# Patient Record
Sex: Male | Born: 1945 | Race: White | Hispanic: No | Marital: Married
Health system: Southern US, Community
[De-identification: ages and names within clinical notes are randomized; demographics above are authoritative.]

## PROBLEM LIST (undated history)

## (undated) DIAGNOSIS — C4491 Basal cell carcinoma of skin, unspecified: Secondary | ICD-10-CM

## (undated) DIAGNOSIS — C801 Malignant (primary) neoplasm, unspecified: Secondary | ICD-10-CM

## (undated) HISTORY — DX: Basal cell carcinoma of skin, unspecified: C44.91

---

## 2017-08-06 DIAGNOSIS — D239 Other benign neoplasm of skin, unspecified: Secondary | ICD-10-CM

## 2017-08-06 HISTORY — DX: Other benign neoplasm of skin, unspecified: D23.9

## 2019-08-15 ENCOUNTER — Ambulatory Visit (INDEPENDENT_AMBULATORY_CARE_PROVIDER_SITE_OTHER): Payer: Medicare Other | Admitting: Dermatology

## 2019-08-15 ENCOUNTER — Other Ambulatory Visit: Payer: Self-pay

## 2019-08-15 ENCOUNTER — Encounter: Payer: Self-pay | Admitting: Dermatology

## 2019-08-15 DIAGNOSIS — F424 Excoriation (skin-picking) disorder: Secondary | ICD-10-CM

## 2019-08-15 DIAGNOSIS — D229 Melanocytic nevi, unspecified: Secondary | ICD-10-CM

## 2019-08-15 DIAGNOSIS — D2239 Melanocytic nevi of other parts of face: Secondary | ICD-10-CM

## 2019-08-15 DIAGNOSIS — D1801 Hemangioma of skin and subcutaneous tissue: Secondary | ICD-10-CM | POA: Diagnosis not present

## 2019-08-15 DIAGNOSIS — T148XXA Other injury of unspecified body region, initial encounter: Secondary | ICD-10-CM

## 2019-08-15 DIAGNOSIS — D2262 Melanocytic nevi of left upper limb, including shoulder: Secondary | ICD-10-CM

## 2019-08-15 DIAGNOSIS — L814 Other melanin hyperpigmentation: Secondary | ICD-10-CM

## 2019-08-15 DIAGNOSIS — Z85828 Personal history of other malignant neoplasm of skin: Secondary | ICD-10-CM | POA: Diagnosis not present

## 2019-08-15 DIAGNOSIS — L821 Other seborrheic keratosis: Secondary | ICD-10-CM

## 2019-08-15 DIAGNOSIS — L578 Other skin changes due to chronic exposure to nonionizing radiation: Secondary | ICD-10-CM

## 2019-08-15 DIAGNOSIS — Z1283 Encounter for screening for malignant neoplasm of skin: Secondary | ICD-10-CM | POA: Diagnosis not present

## 2019-08-15 NOTE — Progress Notes (Signed)
   Follow-Up Visit   Subjective  Lee Young is a 74 y.o. male who presents for the following: TBSE (spot on frontal scalp for past month, has been picking at it.)  He has h/o [facility]  The following portions of the chart were reviewed this encounter and updated as appropriate:      Review of Systems:  No other skin or systemic complaints except as noted in HPI or Assessment and Plan.  Objective  Well appearing patient in no apparent distress; mood and affect are within normal limits.  A full examination was performed including scalp, head, eyes, ears, nose, lips, neck, chest, axillae, abdomen, back, buttocks, bilateral upper extremities, bilateral lower extremities, hands, feet, fingers, toes, fingernails, and toenails. All findings within normal limits unless otherwise noted below.  Objective  Left Frontal scalp: Excoriated pink macule  Objective  Left Postauricular Area: Red papule   Objective  Mid Occipital Scalp and Right ear: Well healed scar with no evidence of recurrence.   Objective  Right Tip of Nose: 4 mm flesh brown papule  Left Dorsum of Foot: 3 mm brown macule   Assessment & Plan  Excoriation Left Frontal scalp  Vaseline until healed, avoid picking area RTC if doesn't heal      Hemangioma of skin Left Postauricular Area  Benign, observe.  Discussed shave removal if irritated.   History of basal cell carcinoma (BCC) Mid Occipital Scalp and Right ear  Clear. Observe for recurrence. Call clinic for new or changing lesions.  Recommend regular skin exams, daily broad-spectrum spf 30+ sunscreen use, and photoprotection.     Nevus (2) Left Dorsum of Foot; Right Tip of Nose  Benign-appearing.  Observation.  Call clinic for new or changing moles.  Recommend daily use of broad spectrum spf 30+ sunscreen to sun-exposed areas.    Lentigines - Scattered tan macules - Discussed due to sun exposure - Benign, observe - Call for any  changes  Seborrheic Keratoses - Stuck-on, waxy, tan-brown papules and plaques  - Discussed benign etiology and prognosis. - Observe - Call for any changes Recommend Gold Bond Rough and Bumpy, Amlactin, Eucerin Roughness Relief  Melanocytic Nevi - Tan-brown and/or pink-flesh-colored symmetric macules and papules - Benign appearing on exam today - Observation - Call clinic for new or changing moles - Recommend daily use of broad spectrum spf 30+ sunscreen to sun-exposed areas.   Hemangiomas - Red papules - Discussed benign nature - Observe - Call for any changes  Actinic Damage - diffuse scaly erythematous macules with underlying dyspigmentation - Recommend daily broad spectrum sunscreen SPF 30+ to sun-exposed areas, reapply every 2 hours as needed.  - Call for new or changing lesions.  Skin cancer screening performed today.   Return in about 1 year (around 08/14/2020) for TBSE.   I, Donzetta Kohut, CMA, am acting as scribe for Brendolyn Patty, MD .  Documentation: I have reviewed the above documentation for accuracy and completeness, and I agree with the above.  Brendolyn Patty MD

## 2019-08-15 NOTE — Patient Instructions (Signed)
Recommend daily broad spectrum sunscreen SPF 30+ to sun-exposed areas, reapply every 2 hours as needed. Call for new or changing lesions.  

## 2020-05-22 ENCOUNTER — Other Ambulatory Visit: Payer: Self-pay

## 2020-05-22 ENCOUNTER — Ambulatory Visit (INDEPENDENT_AMBULATORY_CARE_PROVIDER_SITE_OTHER): Payer: Medicare Other | Admitting: Dermatology

## 2020-05-22 DIAGNOSIS — L300 Nummular dermatitis: Secondary | ICD-10-CM

## 2020-05-22 MED ORDER — CLOBETASOL PROPIONATE 0.05 % EX SOLN: CUTANEOUS | 1 refills | Status: DC

## 2020-05-22 NOTE — Progress Notes (Signed)
   Follow-Up Visit   Subjective  Lee Young is a 75 y.o. male who presents for the following: Rash (Patient here today for a rash that started on his legs in late January. Since then it has spread to trunk. Rash is itchy initially, but improved when he uses triamcinolone 0.5% ointment. He used to use Lever 2000 soap, but recently switched to [place] or Caress soap. For the past two days, he has used an antifungal soap (Defense). No new medicines.). No h/o eczema.   The following portions of the chart were reviewed this encounter and updated as appropriate:       Review of Systems:  No other skin or systemic complaints except as noted in HPI or Assessment and Plan.  Objective  Well appearing patient in no apparent distress; mood and affect are within normal limits.  A focused examination was performed including trunk, extremities. Relevant physical exam findings are noted in the Assessment and Plan.  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 Avoid face, groin, axilla.  Up to four weeks.  May spot treat severe areas with TMC 0.5% ointment qd/bid prn. Avoid f/g/a.  Recommend mild soap and moisturizing cream 1-2 times daily to help prevent flares. Dry skin care handout given   Topical steroids (such as triamcinolone, fluocinolone, fluocinonide, mometasone, clobetasol, halobetasol, betamethasone, hydrocortisone) can cause thinning and lightening of the skin if they are used for too long in the same area. Your physician has selected the right strength medicine for your problem and area affected on the body. Please use your medication only  as directed by your physician to prevent side effects.    clobetasol (TEMOVATE) 0.05 % external solution - legs, abdomen  Return as scheduled.  IJamesetta Orleans, CMA, am acting as scribe for Brendolyn Patty, MD .  Documentation: I have reviewed the above documentation for accuracy and completeness, and I agree with the above.  Brendolyn Patty MD

## 2020-05-22 NOTE — Patient Instructions (Addendum)
Eczema Skin Care  Buy TWO 16oz jars of CeraVe moisturizing cream  [facility], [facility], [facility] (no prescription needed)  Costs about $15 per jar   Jar #1: Use as a moisturizer as needed. Can be applied to any area of the body. Use twice daily to unaffected areas.  Jar #2: Pour one 73ml bottle of clobetasol 0.05% solution into jar, mix well. Label this jar to indicate the medication has been added. Use twice daily to affected areas. Do not apply to face, groin or underarms.  Moisturizer may burn or sting initially. Try for at least 4 weeks.    Dry Skin Care  What causes dry skin?  Dry skin is common and results from inadequate moisture in the outer skin layers. Dry skin usually results from the excessive loss of moisture from the skin surface. This occurs due to two major factors: 1. Normally the skin's oil glands deposit a layer of oil on the skin's surface. This layer of oil prevents the loss of moisture from the skin. Exposure to soaps, cleaners, solvents, and disinfectants removes this oily film, allowing water to escape. 2. Water loss from the skin increases when the humidity is low. During winter months we spend a lot of time indoors where the air is heated. Heated air has very low humidity. This also contributes to dry skin.  A tendency for dry skin may accompany such disorders as eczema. Also, as people age, the number of functioning oil glands decreases, and the tendency toward dry skin can be a sensation of skin tightness when emerging from the shower.  How do I manage dry skin?  1. Humidify your environment. This can be accomplished by using a humidifier in your bedroom at night during winter months. 2. Bathing can actually put moisture back into your skin if done right. Take the following steps while bathing to sooth dry skin:  Avoid hot water, which only dries the skin and makes itching worse. Use warm water.  Avoid washcloths or extensive rubbing or scrubbing.  Use mild soaps  like unscented Dove, Oil of Olay, Cetaphil, Basis, or CeraVe.  If you take baths rather than showers, rinse off soap residue with clean water before getting out of tub.  Once out of the shower/tub, pat dry gently with a soft towel. Leave your skin damp.  While still damp, apply any medicated ointment/cream you were prescribed to the affected areas. After you apply your medicated ointment/cream, then apply your moisturizer to your whole body.This is the most important step in dry skin care. If this is omitted, your skin will continue to be dry.  The choice of moisturizer is also very important. In general, lotion will not provider enough moisture to severely dry skin because it is water based. You should use an ointment or cream. Moisturizers should also be unscented. Good choices include Vaseline (plain petrolatum), Aquaphor, Cetaphil, CeraVe, Vanicream, DML Forte, Aveeno moisture, or Eucerin Cream.  Bath oils can be helpful, but do not replace the application of moisturizer after the bath. In addition, they make the tub slippery causing an increased risk for falls. Therefore, we do not recommend their use.

## 2020-07-09 ENCOUNTER — Emergency Department
Admission: EM | Admit: 2020-07-09 | Discharge: 2020-07-09 | Disposition: A | Discharge status: Home / Self Care | Payer: Medicare Other | Attending: Emergency Medicine | Admitting: Emergency Medicine

## 2020-07-09 ENCOUNTER — Emergency Department: Payer: Medicare Other

## 2020-07-09 ENCOUNTER — Other Ambulatory Visit: Payer: Self-pay

## 2020-07-09 DIAGNOSIS — Z20822 Contact with and (suspected) exposure to covid-19: Secondary | ICD-10-CM | POA: Insufficient documentation

## 2020-07-09 DIAGNOSIS — R1013 Epigastric pain: Secondary | ICD-10-CM | POA: Insufficient documentation

## 2020-07-09 DIAGNOSIS — R61 Generalized hyperhidrosis: Secondary | ICD-10-CM | POA: Insufficient documentation

## 2020-07-09 DIAGNOSIS — Z859 Personal history of malignant neoplasm, unspecified: Secondary | ICD-10-CM | POA: Insufficient documentation

## 2020-07-09 DIAGNOSIS — R0602 Shortness of breath: Secondary | ICD-10-CM | POA: Diagnosis not present

## 2020-07-09 DIAGNOSIS — J189 Pneumonia, unspecified organism: Secondary | ICD-10-CM | POA: Diagnosis not present

## 2020-07-09 DIAGNOSIS — R071 Chest pain on breathing: Secondary | ICD-10-CM

## 2020-07-09 DIAGNOSIS — R0789 Other chest pain: Secondary | ICD-10-CM

## 2020-07-09 DIAGNOSIS — R072 Precordial pain: Secondary | ICD-10-CM | POA: Diagnosis present

## 2020-07-09 HISTORY — DX: Malignant (primary) neoplasm, unspecified: C80.1

## 2020-07-09 LAB — BASIC METABOLIC PANEL
Anion gap: 6 (ref 5–15)
BUN: 11 mg/dL (ref 8–23)
CO2: 28 mmol/L (ref 22–32)
Calcium: 9.2 mg/dL (ref 8.9–10.3)
Chloride: 103 mmol/L (ref 98–111)
Creatinine, Ser: 0.79 mg/dL (ref 0.61–1.24)
GFR, Estimated: >60 mL/min (ref >60)
Glucose, Bld: 116 mg/dL — ABNORMAL HIGH (ref 70–99)
Potassium: 3.8 mmol/L (ref 3.5–5.1)
Sodium: 137 mmol/L (ref 135–145)

## 2020-07-09 LAB — TROPONIN I (HIGH SENSITIVITY)
Troponin I (High Sensitivity): 5 ng/L (ref <18)
Troponin I (High Sensitivity): 5 ng/L (ref <18)

## 2020-07-09 LAB — CBC
HCT: 42.1 % (ref 39.0–52.0)
Hemoglobin: 14.5 g/dL (ref 13.0–17.0)
MCH: 31.4 pg (ref 26.0–34.0)
MCHC: 34.4 g/dL (ref 30.0–36.0)
MCV: 91.1 fL (ref 80.0–100.0)
Platelets: 156 10*3/uL (ref 150–400)
RBC: 4.62 MIL/uL (ref 4.22–5.81)
RDW: 12.6 % (ref 11.5–15.5)
WBC: 12 10*3/uL — ABNORMAL HIGH (ref 4.0–10.5)
nRBC: 0 % (ref 0.0–0.2)

## 2020-07-09 LAB — HEPATIC FUNCTION PANEL
ALT: 13 U/L (ref 0–44)
AST: 24 U/L (ref 15–41)
Albumin: 4.3 g/dL (ref 3.5–5.0)
Alkaline Phosphatase: 42 U/L (ref 38–126)
Bilirubin, Direct: 0.2 mg/dL (ref 0.0–0.2)
Indirect Bilirubin: 1.1 mg/dL — ABNORMAL HIGH (ref 0.3–0.9)
Total Bilirubin: 1.3 mg/dL — ABNORMAL HIGH (ref 0.3–1.2)
Total Protein: 6.6 g/dL (ref 6.5–8.1)

## 2020-07-09 LAB — LIPASE, BLOOD: Lipase: 27 U/L (ref 11–51)

## 2020-07-09 LAB — RESP PANEL BY RT-PCR (FLU A&B, COVID) ARPGX2
Influenza A by PCR: NEGATIVE
Influenza B by PCR: NEGATIVE
SARS Coronavirus 2 by RT PCR: NEGATIVE

## 2020-07-09 LAB — BRAIN NATRIURETIC PEPTIDE: B Natriuretic Peptide: 39.4 pg/mL (ref 0.0–100.0)

## 2020-07-09 IMAGING — CT CT ANGIO CHEST-ABD-PELV FOR DISSECTION W/ AND WO/W CM
2 of 7 series · 13 of 46 positions shown, 15 images · non-contrast
Comparison: Chest radiograph [DATE]

CLINICAL DATA: Midsternal chest pain and shortness of breath
starting a few hours ago.

EXAM:
CT ANGIOGRAPHY CHEST, ABDOMEN AND PELVIS
TECHNIQUE: Non-contrast CT of the chest was initially obtained.

[Series 5: axial arterial · axial · arterial · 0.78mm/px · z∈[-161,+379]mm · 10 of 220 slices shown, 12 images]
[im 20/220  soft-tissue]
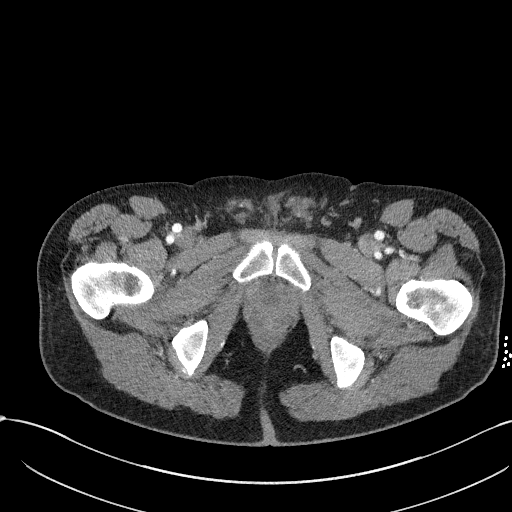
[im 20/220  bone]
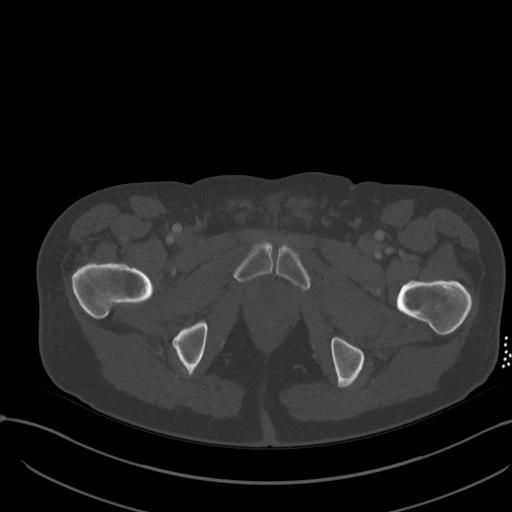
[im 40/220  soft-tissue]
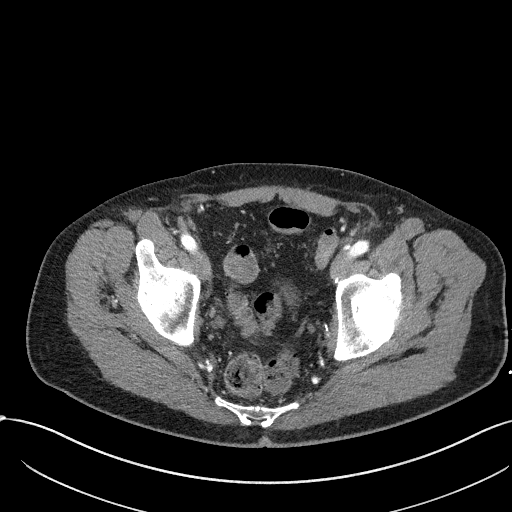
[im 60/220  soft-tissue]
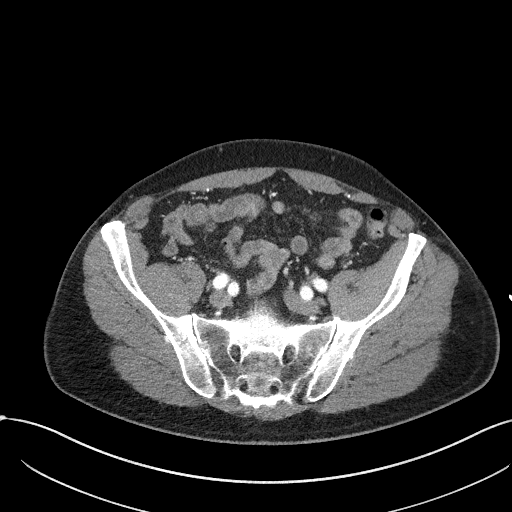
[im 80/220  soft-tissue]
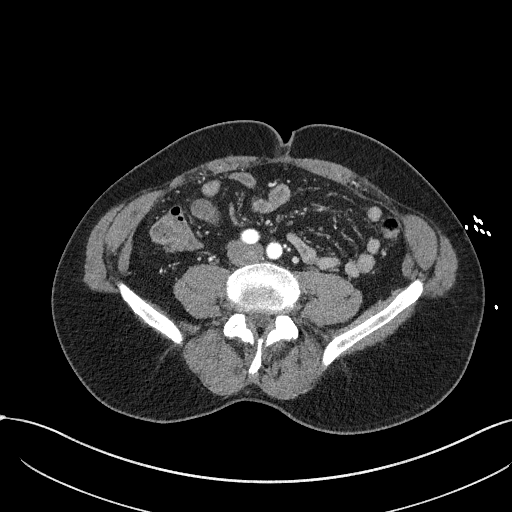
[im 100/220  soft-tissue]
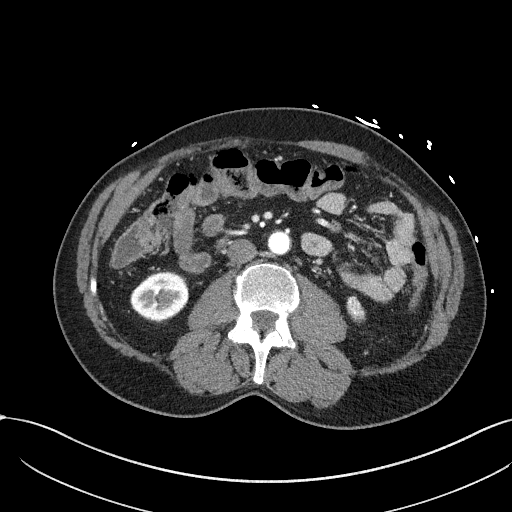
[im 120/220  soft-tissue]
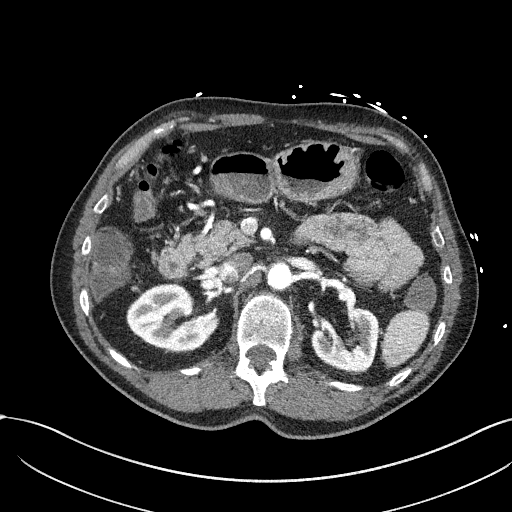
[im 140/220  soft-tissue]
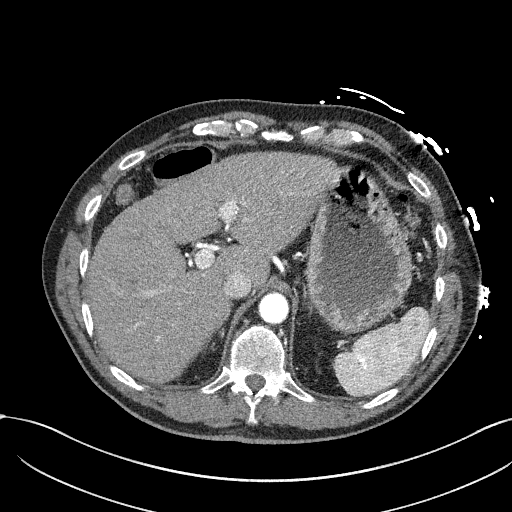
[im 160/220  soft-tissue]
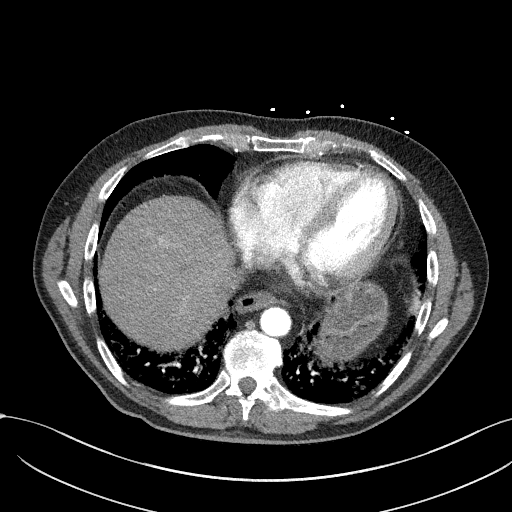
[im 180/220  soft-tissue]
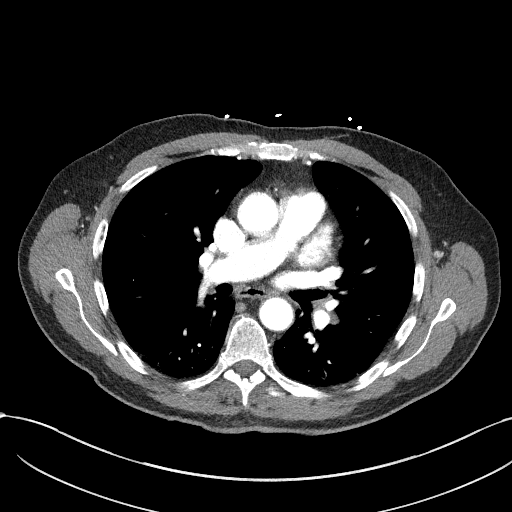
[im 180/220  bone]
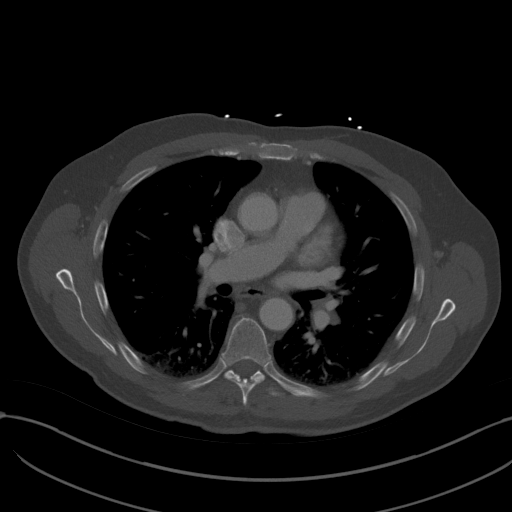
[im 200/220  soft-tissue]
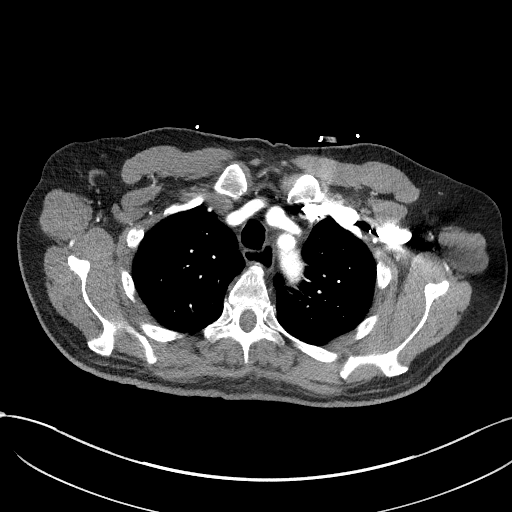

[Series 7: coronals · coronal · 0.83mm/px · 3 of 135 slices shown]
[im 34/135  soft-tissue]
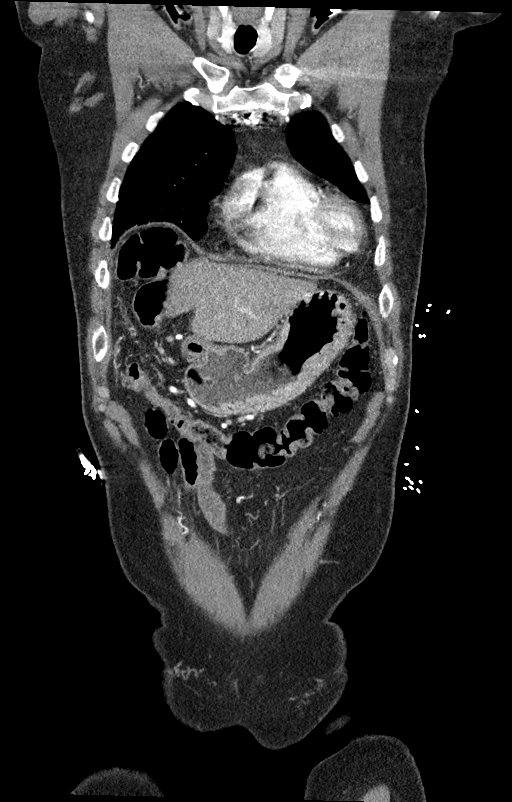
[im 68/135  soft-tissue]
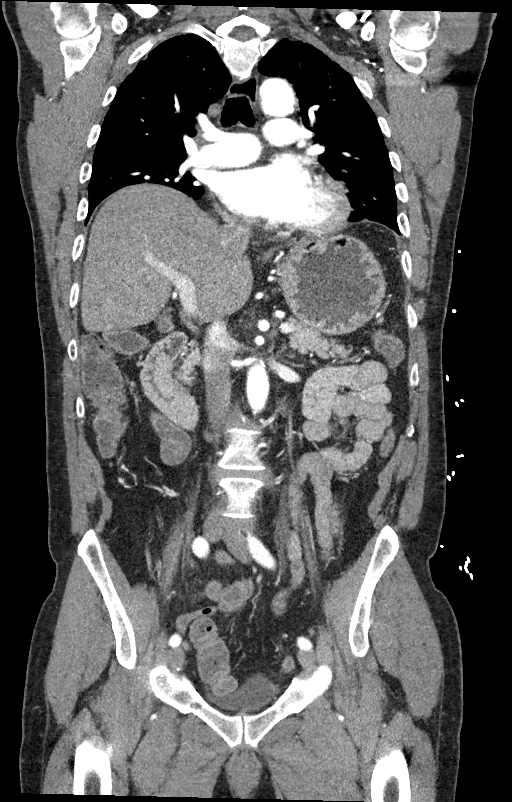
[im 101/135  soft-tissue]
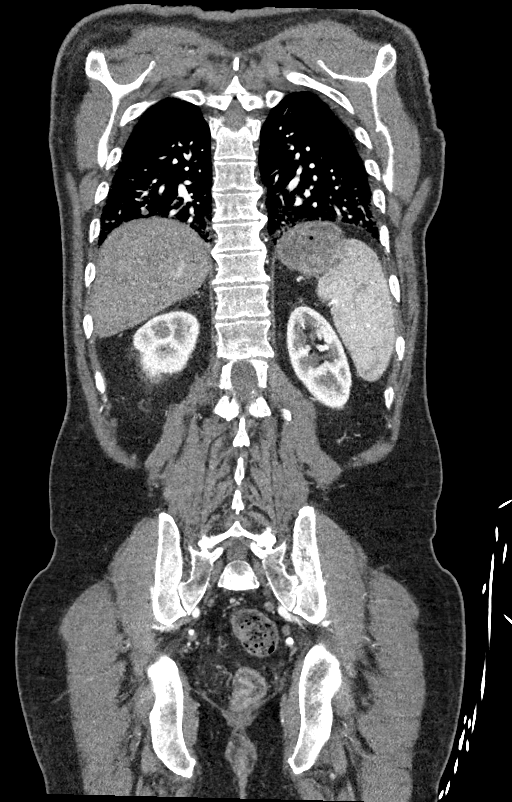

[13 of 46 positions shown; findings below may reference images not displayed]

Multidetector CT imaging through the chest, abdomen and pelvis was
performed using the standard protocol during bolus administration of
intravenous contrast. Multiplanar reconstructed images and MIPs were
obtained and reviewed to evaluate the vascular anatomy.

CONTRAST:  100mL OMNIPAQUE IOHEXOL 350 MG/ML SOLN
FINDINGS: CTA CHEST FINDINGS

Cardiovascular: Unenhanced images of the chest demonstrate scattered
aortic calcification. No evidence of intramural hematoma.

Images obtained during the arterial phase after IV contrast
administration demonstrate normal caliber thoracic aorta. No
aneurysm or dissection. Motion artifact in the aortic root. Great
vessel origins are patent. Central pulmonary arteries are patent
without evidence of significant pulmonary embolus. Normal heart
size. No pericardial effusions.

Mediastinum/Nodes: Esophagus is decompressed. No significant
lymphadenopathy. Thyroid gland is unremarkable.

Lungs/Pleura: Atelectasis or consolidation in both lung bases. No
pleural effusion or pneumothorax.

Musculoskeletal: No chest wall abnormality. No acute or significant
osseous findings.

Review of the MIP images confirms the above findings.

CTA ABDOMEN AND PELVIS FINDINGS

VASCULAR

Aorta: Normal caliber aorta without aneurysm, dissection, vasculitis
or significant stenosis.

Celiac: Patent without evidence of aneurysm, dissection, vasculitis
or significant stenosis.

SMA: Patent without evidence of aneurysm, dissection, vasculitis or
significant stenosis.

Renals: Both renal arteries are patent without evidence of aneurysm,
dissection, vasculitis, fibromuscular dysplasia or significant
stenosis.

IMA: Patent without evidence of aneurysm, dissection, vasculitis or
significant stenosis.

Inflow: Patent without evidence of aneurysm, dissection, vasculitis
or significant stenosis.

Veins: No obvious venous abnormality within the limitations of this
arterial phase study.

Review of the MIP images confirms the above findings.

NON-VASCULAR

Hepatobiliary: Mild diffuse fatty infiltration of the liver.
Low-attenuation lesion in the lateral segment left lobe, likely a
cyst. Portal veins are patent. Mild gallbladder wall thickening
likely due to contraction. No gallstones or bile duct dilatation
identified.

Pancreas: Unremarkable. No pancreatic ductal dilatation or
surrounding inflammatory changes.

Spleen: Normal in size without focal abnormality.

Adrenals/Urinary Tract: Adrenal glands are unremarkable. Kidneys are
normal, without renal calculi, focal lesion, or hydronephrosis.
Bladder is unremarkable.

Stomach/Bowel: The stomach, small bowel, and colon are not
abnormally distended. No wall thickening or inflammatory changes are
appreciated. Appendix is not identified.

Lymphatic: No significant lymphadenopathy.

Reproductive: Prostate is unremarkable.

Other: No free air or free fluid in the abdomen.

Musculoskeletal: No acute or significant osseous findings.

Review of the MIP images confirms the above findings.
IMPRESSION: 1. No evidence of aneurysm or dissection of the thoracic or
abdominal aorta.
2. No evidence of significant pulmonary embolus.
3. Atelectasis or consolidation in both lung bases.
4. Mild diffuse fatty infiltration of the liver.
5. Benign-appearing cyst in the left lobe of the liver.
6. No acute process demonstrated in the abdomen or pelvis.

Aortic Atherosclerosis ([JE]-[JE]).

## 2020-07-09 MED ORDER — SODIUM CHLORIDE 0.9 % IV BOLUS
1000.0000 mL | Freq: Once | INTRAVENOUS | Status: AC
  Administered 2020-07-09: 1000 mL via INTRAVENOUS

## 2020-07-09 MED ORDER — ONDANSETRON HCL 4 MG/2ML IJ SOLN
4.0000 mg | Freq: Once | INTRAMUSCULAR | Status: AC
  Administered 2020-07-09: 4 mg via INTRAVENOUS
  Filled 2020-07-09: qty 2

## 2020-07-09 MED ORDER — NAPROXEN 375 MG PO TABS: 375.0000 mg | ORAL_TABLET | Freq: Two times a day (BID) | ORAL | 0 refills | Status: AC

## 2020-07-09 MED ORDER — DEXAMETHASONE SODIUM PHOSPHATE 10 MG/ML IJ SOLN
10.0000 mg | Freq: Once | INTRAMUSCULAR | Status: AC
  Administered 2020-07-09: 10 mg via INTRAVENOUS
  Filled 2020-07-09: qty 1

## 2020-07-09 MED ORDER — ASPIRIN 81 MG PO CHEW
324.0000 mg | CHEWABLE_TABLET | Freq: Once | ORAL | Status: AC
  Administered 2020-07-09: 324 mg via ORAL
  Filled 2020-07-09: qty 4

## 2020-07-09 MED ORDER — KETOROLAC TROMETHAMINE 30 MG/ML IJ SOLN
15.0000 mg | Freq: Once | INTRAMUSCULAR | Status: AC
  Administered 2020-07-09: 15 mg via INTRAVENOUS
  Filled 2020-07-09: qty 1

## 2020-07-09 MED ORDER — MORPHINE SULFATE (PF) 4 MG/ML IV SOLN
4.0000 mg | Freq: Once | INTRAVENOUS | Status: DC
  Filled 2020-07-09: qty 1

## 2020-07-09 MED ORDER — IOHEXOL 350 MG/ML SOLN
100.0000 mL | Freq: Once | INTRAVENOUS | Status: AC | PRN
  Administered 2020-07-09: 100 mL via INTRAVENOUS

## 2020-07-09 MED ORDER — AZITHROMYCIN 250 MG PO TABS: ORAL_TABLET | ORAL | 0 refills | Status: DC

## 2020-07-09 MED ORDER — NITROGLYCERIN 0.4 MG SL SUBL
0.4000 mg | SUBLINGUAL_TABLET | SUBLINGUAL | Status: DC | PRN
  Administered 2020-07-09: 0.4 mg via SUBLINGUAL
  Filled 2020-07-09: qty 1

## 2020-07-09 NOTE — ED Provider Notes (Signed)
[facility] Emergency Department Provider Note  ____________________________________________   Event Date/Time   First MD Initiated Contact with Patient 07/09/20 1816     (approximate)  I have reviewed the triage vital signs and the nursing notes.   HISTORY  Chief Complaint Chest Pain    HPI Lee Young is a 75 y.o. male with  history of hypertension, hyperlipidemia, here with chest pain.  The patient states that earlier this afternoon, he developed acute, severe, substernal and epigastric chest pain with associated shortness of breath.  He was just resting at the time.  He had been at the beach over the weekend but denies any significant trauma.  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  He reports that over the last hour or 2, the pain has worsened.  Shortness of breath is improved somewhat but he has now developed nausea and sensation like he needs to vomit.  Denies any preceding symptoms.  No leg swelling.  He drove several hours to the beach but has no leg swelling or signs of DVT or PE.  No history of cardiac issues.  No recent medication changes.  Pain seems slightly worse when lying flat.  No specific alleviating factors.       Past Medical History:  Diagnosis Date  . Cancer ([place])   . 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   History reviewed. No pertinent surgical history.  Prior to Admission medications   Medication Sig Start Date End Date Taking? Authorizing Provider  azithromycin (ZITHROMAX Z-PAK) 250 MG tablet Take 2 tablets (500 mg) on  Day 1,  followed by 1 tablet (250 mg) once daily on Days 2 through 5. 07/09/20  Yes Duffy Bruce, MD  naproxen (NAPROSYN) 375 MG tablet Take 1 tablet (375 mg total) by mouth 2 (two) times daily with a meal for 7 days. 07/09/20 07/16/20 Yes Duffy Bruce, MD  atorvastatin (LIPITOR) 10 MG tablet Take by  mouth. 11/01/18   [provider]  clobetasol (TEMOVATE) 0.05 % external solution Patient to mix solution in jar of CeraVe Cream. Apply all over twice daily until rash improved. Avoid face, groin, underarms. 05/22/20   Brendolyn Patty, MD  famotidine (PEPCID) 40 MG tablet Take by mouth. 12/06/18 12/06/19  [provider]  omeprazole (PRILOSEC) 20 MG capsule Take by mouth. 12/06/18   [provider]    Allergies Patient has no known allergies.  No family history on file.  Social History    Review of Systems  Review of Systems  Constitutional: Positive for fatigue. Negative for chills and fever.  HENT: Negative for sore throat.   Respiratory: Positive for chest tightness and shortness of breath.   Cardiovascular: Negative for chest pain.  Gastrointestinal: Positive for nausea. Negative for abdominal pain.  Genitourinary: Negative for flank pain.  Musculoskeletal: Negative for neck pain.  Skin: Negative for rash and wound.  Allergic/Immunologic: Negative for immunocompromised state.  Neurological: Positive for weakness. Negative for numbness.  Hematological: Does not bruise/bleed easily.  All other systems reviewed and are negative.    ____________________________________________  PHYSICAL EXAM:      VITAL SIGNS: ED Triage Vitals  Enc Vitals Group     BP 07/09/20 1815 (!) 176/92     Pulse Rate 07/09/20 1815 85     Resp 07/09/20 1815 (!) 26     Temp 07/09/20 1816 97.7  F (36.5 C)     Temp Source 07/09/20 1816 Oral     SpO2 07/09/20 1815 97 %     Weight 07/09/20 1813 170 lb (77.1 kg)     Height 07/09/20 1813 5\' 10"  (1.778 m)     Head Circumference --      Peak Flow --      Pain Score 07/09/20 1813 8     Pain Loc --      Pain Edu? --      Excl. in [place]? --      Physical Exam Vitals and nursing note reviewed.  Constitutional:      General: He is not in acute distress.    Appearance: He is well-developed. He is diaphoretic.  HENT:     Head:  Normocephalic and atraumatic.  Eyes:     Conjunctiva/sclera: Conjunctivae normal.  Cardiovascular:     Rate and Rhythm: Normal rate and regular rhythm.     Heart sounds: Normal heart sounds. No murmur heard. No friction rub.  Pulmonary:     Effort: Pulmonary effort is normal. No respiratory distress.     Breath sounds: Normal breath sounds. No wheezing or rales.  Abdominal:     General: There is no distension.     Palpations: Abdomen is soft.     Tenderness: There is no abdominal tenderness.     Comments: Mild epigastric  Musculoskeletal:     Cervical back: Neck supple.  Skin:    General: Skin is warm.     Capillary Refill: Capillary refill takes less than 2 seconds.  Neurological:     Mental Status: He is alert and oriented to person, place, and time.     Motor: No abnormal muscle tone.       ____________________________________________   LABS (all labs ordered are listed, but only abnormal results are displayed)  Labs Reviewed  BASIC METABOLIC PANEL - Abnormal; Notable for the following components:      Result Value   Glucose, Bld 116 (*)    All other components within normal limits  CBC - Abnormal; Notable for the following components:   WBC 12.0 (*)    All other components within normal limits  HEPATIC FUNCTION PANEL - Abnormal; Notable for the following components:   Total Bilirubin 1.3 (*)    Indirect Bilirubin 1.1 (*)    All other components within normal limits  RESP PANEL BY RT-PCR (FLU A&B, COVID) ARPGX2  BRAIN NATRIURETIC PEPTIDE  LIPASE, BLOOD  TROPONIN I (HIGH SENSITIVITY)  TROPONIN I (HIGH SENSITIVITY)    ____________________________________________  EKG: Normal sinus rhythm, ventricular rate 88.  PR 164, QRS 96, QTc 423.  No acute ST elevations or depressions. ________________________________________  RADIOLOGY All imaging, including plain films, CT scans, and ultrasounds, independently reviewed by me, and interpretations confirmed via formal  radiology reads.  ED MD interpretation:   CT Dissection: No dissection, no PE, atelectasis or consolidation both lung bases CXR: L basilar atelectasis  Official radiology report(s): DG Chest 1 View  Result Date: 07/09/2020 CLINICAL DATA:  Chest pain and shortness of breath. EXAM: CHEST  1 VIEW COMPARISON:  None. FINDINGS: Lung volumes are low. Upper normal heart size. Normal mediastinal contours. Mild left basilar atelectasis. No confluent consolidation. No pulmonary edema, pneumothorax or large pleural effusion. No acute osseous abnormalities are seen. IMPRESSION: Low lung volumes with left basilar atelectasis. Electronically Signed   By: Keith Rake M.D.   On: 07/09/2020 19:22   CT Angio Chest/Abd/Pel for  Dissection W and/or Wo Contrast  Result Date: 07/09/2020 CLINICAL DATA:  Midsternal chest pain and shortness of breath starting a few hours ago. EXAM: CT ANGIOGRAPHY CHEST, ABDOMEN AND PELVIS TECHNIQUE: Non-contrast CT of the chest was initially obtained. Multidetector CT imaging through the chest, abdomen and pelvis was performed using the standard protocol during bolus administration of intravenous contrast. Multiplanar reconstructed images and MIPs were obtained and reviewed to evaluate the vascular anatomy. CONTRAST:  121mL OMNIPAQUE IOHEXOL 350 MG/ML SOLN COMPARISON:  Chest radiograph 07/09/2020 FINDINGS: CTA CHEST FINDINGS Cardiovascular: Unenhanced images of the chest demonstrate scattered aortic calcification. No evidence of intramural hematoma. Images obtained during the arterial phase after IV contrast administration demonstrate normal caliber thoracic aorta. No aneurysm or dissection. Motion artifact in the aortic root. Great vessel origins are patent. Central pulmonary arteries are patent without evidence of significant pulmonary embolus. Normal heart size. No pericardial effusions. Mediastinum/Nodes: Esophagus is decompressed. No significant lymphadenopathy. Thyroid gland is  unremarkable. Lungs/Pleura: Atelectasis or consolidation in both lung bases. No pleural effusion or pneumothorax. Musculoskeletal: No chest wall abnormality. No acute or significant osseous findings. Review of the MIP images confirms the above findings. CTA ABDOMEN AND PELVIS FINDINGS VASCULAR Aorta: Normal caliber aorta without aneurysm, dissection, vasculitis or significant stenosis. Celiac: Patent without evidence of aneurysm, dissection, vasculitis or significant stenosis. SMA: Patent without evidence of aneurysm, dissection, vasculitis or significant stenosis. Renals: Both renal arteries are patent without evidence of aneurysm, dissection, vasculitis, fibromuscular dysplasia or significant stenosis. IMA: Patent without evidence of aneurysm, dissection, vasculitis or significant stenosis. Inflow: Patent without evidence of aneurysm, dissection, vasculitis or significant stenosis. Veins: No obvious venous abnormality within the limitations of this arterial phase study. Review of the MIP images confirms the above findings. NON-VASCULAR Hepatobiliary: Mild diffuse fatty infiltration of the liver. Low-attenuation lesion in the lateral segment left lobe, likely a cyst. Portal veins are patent. Mild gallbladder wall thickening likely due to contraction. No gallstones or bile duct dilatation identified. Pancreas: Unremarkable. No pancreatic ductal dilatation or surrounding inflammatory changes. Spleen: Normal in size without focal abnormality. Adrenals/Urinary Tract: Adrenal glands are unremarkable. Kidneys are normal, without renal calculi, focal lesion, or hydronephrosis. Bladder is unremarkable. Stomach/Bowel: The stomach, small bowel, and colon are not abnormally distended. No wall thickening or inflammatory changes are appreciated. Appendix is not identified. Lymphatic: No significant lymphadenopathy. Reproductive: Prostate is unremarkable. Other: No free air or free fluid in the abdomen. Musculoskeletal: No acute  or significant osseous findings. Review of the MIP images confirms the above findings. IMPRESSION: 1. No evidence of aneurysm or dissection of the thoracic or abdominal aorta. 2. No evidence of significant pulmonary embolus. 3. Atelectasis or consolidation in both lung bases. 4. Mild diffuse fatty infiltration of the liver. 5. Benign-appearing cyst in the left lobe of the liver. 6. No acute process demonstrated in the abdomen or pelvis. Aortic Atherosclerosis (ICD10-I70.0). Electronically Signed   By: Lucienne Capers M.D.   On: 07/09/2020 20:26    ____________________________________________  PROCEDURES   Procedure(s) performed (including Critical Care):  Procedures  ____________________________________________  INITIAL IMPRESSION / MDM / [place] / ED COURSE  As part of my medical decision making, I reviewed the following data within the [place] notes reviewed and incorporated, Old chart reviewed, Notes from prior ED visits, and [place] Controlled Substance Database       *Lee Young was evaluated in Emergency Department on 07/10/2020 for the symptoms described in the history of present illness. He was evaluated  in the context of the global COVID-19 pandemic, which necessitated consideration that the patient might be at risk for infection with the SARS-CoV-2 virus that causes COVID-19. Institutional protocols and algorithms that pertain to the evaluation of patients at risk for COVID-19 are in a state of rapid change based on information released by regulatory bodies including the CDC and federal and state organizations. These policies and algorithms were followed during the patient's care in the ED.  Some ED evaluations and interventions may be delayed as a result of limited staffing during the pandemic.*     Medical Decision Making:  75 yo M here with acute, somewhat pleuritic and positional chest pain and SOB. Pt in moderate discomfort on arrival,  hypertensive. Pt given nitro, aspirin. EKG nonischemic initially and on repeat x 2. Lab work overall very reassuring - trop negative x 2 despite constant sx, and CBC, CMP unremarkable. Lipase, LFTs normal with no abd ttp to suggest referred pain from GI source. Given his significant pain, HTN, and radiation of pain, CT Angio obtained and shows no dissection or significant PE. Of note, pt has b/l atelectasis vs consolidation on CT. He notes that his somewhat positional, pleuritic pain began after he had been working/moving [place] the beach all day for the last several days. He was exposed to drywall, fumes, and multiple irritants during this time.   Suspect MSK chest wall pain, possibly in setting of pneumonitis vs atypical PNA. Pt is non-toxic, well appearing, satting >95% on RA. VS remain stable. He was given toradol with improvement in ED. Will give dose of decadron, d/c with azithro and course of naproxen. Return precautions given.  ____________________________________________  FINAL CLINICAL IMPRESSION(S) / ED DIAGNOSES  Final diagnoses:  Atypical chest pain  Pneumonitis     MEDICATIONS GIVEN DURING THIS VISIT:  Medications  nitroGLYCERIN (NITROSTAT) SL tablet 0.4 mg (0.4 mg Sublingual Given 07/09/20 1858)  morphine 4 MG/ML injection 4 mg (4 mg Intravenous Not Given 07/09/20 2103)  aspirin chewable tablet 324 mg (324 mg Oral Given 07/09/20 1858)  ondansetron (ZOFRAN) injection 4 mg (4 mg Intravenous Given 07/09/20 1859)  sodium chloride 0.9 % bolus 1,000 mL (0 mLs Intravenous Stopped 07/09/20 2102)  iohexol (OMNIPAQUE) 350 MG/ML injection 100 mL (100 mLs Intravenous Contrast Given 07/09/20 2005)  ketorolac (TORADOL) 30 MG/ML injection 15 mg (15 mg Intravenous Given 07/09/20 2103)  dexamethasone (DECADRON) injection 10 mg (10 mg Intravenous Given 07/09/20 2337)     ED Discharge Orders         Ordered    naproxen (NAPROSYN) 375 MG tablet  2 times daily with meals        07/09/20  2306    azithromycin (ZITHROMAX Z-PAK) 250 MG tablet        07/09/20 2306           Note:  This document was prepared using Dragon voice recognition software and may include unintentional dictation errors.   Duffy Bruce, MD 07/10/20 [phone]

## 2020-07-09 NOTE — ED Triage Notes (Signed)
Pt comes with c/o mid sternal CP and SOB. Pt states this started few hours ago. Pt states he is having more difficulty breathing in and out. Pt states this has gotten worse in the last hour.  Pt denies any radiation, N/V/D.

## 2020-07-09 NOTE — ED Notes (Signed)
Pt to CT with RN

## 2020-07-09 NOTE — ED Notes (Signed)
Pt ambulatory in hallway with pulse ox. SpO2 96% prior to ambulation, decreased to 92-93% with ambulation. Pt reports some increased CP but only d/t having to take deep breaths while ambulating. Denies increased SOB, dizziness, lightheadedness.

## 2020-07-09 NOTE — ED Notes (Signed)
Contacted lab to add on labs.  

## 2020-07-09 NOTE — ED Notes (Signed)
This RN at bedside to introduce self to pt. Pt reports 2/10 chest pain after nitro, states symptoms have lessened after meds. Pt does appear slightly pale on arrival to room, appears uncomfortable. VS checked, initial BP 87/57, BP cuff adjusted, repeat BP 90/51. 1L NS bolus started per verbal order from Ellender Hose MD

## 2020-09-03 ENCOUNTER — Encounter: Payer: Self-pay | Admitting: Dermatology

## 2020-09-03 ENCOUNTER — Ambulatory Visit (INDEPENDENT_AMBULATORY_CARE_PROVIDER_SITE_OTHER): Payer: Medicare Other | Admitting: Dermatology

## 2020-09-03 ENCOUNTER — Other Ambulatory Visit: Payer: Self-pay

## 2020-09-03 DIAGNOSIS — Z85828 Personal history of other malignant neoplasm of skin: Secondary | ICD-10-CM

## 2020-09-03 DIAGNOSIS — L570 Actinic keratosis: Secondary | ICD-10-CM

## 2020-09-03 DIAGNOSIS — L578 Other skin changes due to chronic exposure to nonionizing radiation: Secondary | ICD-10-CM

## 2020-09-03 DIAGNOSIS — L820 Inflamed seborrheic keratosis: Secondary | ICD-10-CM

## 2020-09-03 DIAGNOSIS — L821 Other seborrheic keratosis: Secondary | ICD-10-CM

## 2020-09-03 DIAGNOSIS — D2272 Melanocytic nevi of left lower limb, including hip: Secondary | ICD-10-CM

## 2020-09-03 DIAGNOSIS — D229 Melanocytic nevi, unspecified: Secondary | ICD-10-CM

## 2020-09-03 DIAGNOSIS — D2239 Melanocytic nevi of other parts of face: Secondary | ICD-10-CM

## 2020-09-03 DIAGNOSIS — D1800 Hemangioma unspecified site: Secondary | ICD-10-CM

## 2020-09-03 DIAGNOSIS — L814 Other melanin hyperpigmentation: Secondary | ICD-10-CM

## 2020-09-03 DIAGNOSIS — D2262 Melanocytic nevi of left upper limb, including shoulder: Secondary | ICD-10-CM

## 2020-09-03 DIAGNOSIS — L300 Nummular dermatitis: Secondary | ICD-10-CM | POA: Diagnosis not present

## 2020-09-03 DIAGNOSIS — Z1283 Encounter for screening for malignant neoplasm of skin: Secondary | ICD-10-CM

## 2020-09-03 DIAGNOSIS — Z86018 Personal history of other benign neoplasm: Secondary | ICD-10-CM

## 2020-09-03 MED ORDER — CLOBETASOL PROPIONATE 0.05 % EX SOLN: CUTANEOUS | 2 refills | Status: DC

## 2020-09-03 NOTE — Patient Instructions (Addendum)
Cryotherapy Aftercare  Wash gently with soap and water everyday.   Apply Vaseline and Band-Aid daily until healed.   Gentle Skin Care Guide  1. Bathe no more than once a day.  2. Avoid bathing in hot water  3. Use a mild soap like Dove, Vanicream, Cetaphil, CeraVe. Can use Lever 2000 or Cetaphil antibacterial soap  4. Use soap only where you need it. On most days, use it under your arms, between your legs, and on your feet. Let the water rinse other areas unless visibly dirty.  5. When you get out of the bath/shower, use a towel to gently blot your skin dry, don't rub it.  6. While your skin is still a little damp, apply a moisturizing cream such as Vanicream, CeraVe, Cetaphil, Eucerin, Sarna lotion or plain Vaseline Jelly. For hands apply Neutrogena [place] Hand Cream or Excipial Hand Cream.  7. Reapply moisturizer any time you start to itch or feel dry.  8. Sometimes using free and clear laundry detergents can be helpful. Fabric softener sheets should be avoided. Downy Free & Gentle liquid, or any liquid fabric softener that is free of dyes and perfumes, it acceptable to use  9. If your doctor has given you prescription creams you may apply moisturizers over them     If you have any questions or concerns for your doctor, please call our main line at [phone] and press option 4 to reach your doctor's medical assistant. If no one answers, please leave a voicemail as directed and we will return your call as soon as possible. Messages left after 4 pm will be answered the following business day.   You may also send us a message via MyChart. We typically respond to MyChart messages within 1-2 business days.  For prescription refills, please ask your pharmacy to contact our office. Our fax number is [phone].  If you have an urgent issue when the clinic is closed that cannot wait until the next business day, you can page your doctor at the number below.    Please note that  while we do our best to be available for urgent issues outside of office hours, we are not available 24/7.   If you have an urgent issue and are unable to reach us, you may choose to seek medical care at your doctor's office, retail clinic, urgent care center, or emergency room.  If you have a medical emergency, please immediately call 911 or go to the emergency department.  Pager Numbers  - Dr. Kowalski: [phone]  - Dr. Moye: [phone]  - Dr. Stewart: [phone]  In the event of inclement weather, please call our main line at [phone] for an update on the status of any delays or closures.  Dermatology Medication Tips: Please keep the boxes that topical medications come in in order to help keep track of the instructions about where and how to use these. Pharmacies typically print the medication instructions only on the boxes and not directly on the medication tubes.   If your medication is too expensive, please contact our office at [phone] option 4 or send us a message through MyChart.   We are unable to tell what your co-pay for medications will be in advance as this is different depending on your insurance coverage. However, we may be able to find a substitute medication at lower cost or fill out paperwork to get insurance to cover a needed medication.   If a prior authorization is required to get your medication   covered by your insurance company, please allow us 1-2 business days to complete this process.  Drug prices often vary depending on where the prescription is filled and some pharmacies may offer cheaper prices.  The website www.goodrx.com contains coupons for medications through different pharmacies. The prices here do not account for what the cost may be with help from insurance (it may be cheaper with your insurance), but the website can give you the price if you did not use any insurance.  - You can print the associated coupon and take it with your  prescription to the pharmacy.  - You may also stop by our office during regular business hours and pick up a GoodRx coupon card.  - If you need your prescription sent electronically to a different pharmacy, notify our office through [place] MyChart or by phone at [phone] option 4.  

## 2020-09-03 NOTE — Progress Notes (Signed)
Follow-Up Visit   Subjective  Lee Young is a 75 y.o. male who presents for the following: Annual Exam (Patient presents for TBSE. 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 He has a couple of spots that he picks at on the L sideburn area and L side. ). He also has a rash that comes and goes that he treats with clobetasol/CeraVe cream prn which helps.  He had a recent flare that is now improving.    The following portions of the chart were reviewed this encounter and updated as appropriate:        Review of Systems:  No other skin or systemic complaints except as noted in HPI or Assessment and Plan.  Objective  Well appearing patient in no apparent distress; mood and affect are within normal limits.  All skin waist up examined.  L preauricular  x 1, L flank x 1 (2) Erythematous keratotic or waxy stuck-on papule  R vertex x 1, R crown x 1, frontal scalp x 2, L upper eyebrow x 1, L forearm x 4 (9) Erythematous thin papules/macules with gritty scale.   Left Posterior Shoulder 5.76mm brown macule adjacent to waxy white patch  Right Tip of Nose 5 mm flesh papule, tan center  Left Dorsum of Foot 3.0 mm brown macule  trunk, extremities Mild erythema of the left upper flank and posterior upper thighs.   Assessment & Plan  Skin cancer screening performed today.  Actinic Damage - chronic, secondary to cumulative UV radiation exposure/sun exposure over time - diffuse scaly erythematous macules with underlying dyspigmentation - Recommend daily broad spectrum sunscreen SPF 30+ to sun-exposed areas, reapply every 2 hours as needed.  - Recommend staying in the shade or wearing long sleeves, sun glasses (UVA+UVB protection) and wide brim hats (4-inch brim around the entire circumference of the hat). - Call for new or changing lesions.  History of Basal Cell Carcinoma of  the Skin - No evidence of recurrence today - Recommend regular full body skin exams - Recommend daily broad spectrum sunscreen SPF 30+ to sun-exposed areas, reapply every 2 hours as needed.  - Call if any new or changing lesions are noted between office visits  History of Dysplastic Nevi - No evidence of recurrence today of the right upper forehead - Recommend regular full body skin exams - Recommend daily broad spectrum sunscreen SPF 30+ to sun-exposed areas, reapply every 2 hours as needed.  - Call if any new or changing lesions are noted between office visits  Seborrheic Keratoses - Stuck-on, waxy, tan-brown papules and/or plaques  - Benign-appearing - Discussed benign etiology and prognosis. - Observe - Call for any changes  Lentigines - Scattered tan macules - Due to sun exposure - Benign-appering, observe - Recommend daily broad spectrum sunscreen SPF 30+ to sun-exposed areas, reapply every 2 hours as needed. - Call for any changes  Hemangiomas - Red papules - Discussed benign nature - Observe - Call for any changes  Inflamed seborrheic keratosis L preauricular  x 1, L flank x 1  Destruction of lesion - L preauricular  x 1, L flank x 1  Destruction method: cryotherapy   Informed consent: discussed and consent obtained   Lesion destroyed using liquid nitrogen: Yes   Region frozen until ice ball extended beyond lesion: Yes   Outcome: patient tolerated procedure well with  no complications   Post-procedure details: wound care instructions given   Additional details:  Prior to procedure, discussed risks of blister formation, small wound, skin dyspigmentation, or rare scar following cryotherapy. Recommend Vaseline ointment to treated areas while healing.   AK (actinic keratosis) (9) R vertex x 1, R crown x 1, frontal scalp x 2, L upper eyebrow x 1, L forearm x 4  Actinic keratoses are precancerous spots that appear secondary to cumulative UV radiation exposure/sun  exposure over time. They are chronic with expected duration over 1 year. A portion of actinic keratoses will progress to squamous cell carcinoma of the skin. It is not possible to reliably predict which spots will progress to skin cancer and so treatment is recommended to prevent development of skin cancer.  Recommend daily broad spectrum sunscreen SPF 30+ to sun-exposed areas, reapply every 2 hours as needed.  Recommend staying in the shade or wearing long sleeves, sun glasses (UVA+UVB protection) and wide brim hats (4-inch brim around the entire circumference of the hat). Call for new or changing lesions.  Destruction of lesion - R vertex x 1, R crown x 1, frontal scalp x 2, L upper eyebrow x 1, L forearm x 4  Destruction method: cryotherapy   Informed consent: discussed and consent obtained   Lesion destroyed using liquid nitrogen: Yes   Region frozen until ice ball extended beyond lesion: Yes   Outcome: patient tolerated procedure well with no complications   Post-procedure details: wound care instructions given   Additional details:  Prior to procedure, discussed risks of blister formation, small wound, skin dyspigmentation, or rare scar following cryotherapy. Recommend Vaseline ointment to treated areas while healing.   Nevus (3) Left Posterior Shoulder; Left Dorsum of Foot; Right Tip of Nose  Vs Lentigo (R post shoulder)  Benign-appearing.  Observation.  Call clinic for new or changing moles.  Recommend daily use of broad spectrum spf 30+ sunscreen to sun-exposed areas.   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 Recent flare  Recommend mild soap and moisturizing cream 1-2 times daily.  Gentle skin care handout provided.   Continue Clobetasol/CeraVe mix qd/bid until itchy rash cleared and prn flares. Avoid face, groin, axilla.  Topical steroids (such as triamcinolone, fluocinolone, fluocinonide, mometasone, clobetasol, halobetasol,  betamethasone, hydrocortisone) can cause thinning and lightening of the skin if they are used for too long in the same area. Your physician has selected the right strength medicine for your problem and area affected on the body. Please use your medication only as directed by your physician to prevent side effects.    Related Medications clobetasol (TEMOVATE) 0.05 % external solution Patient to mix solution in jar of CeraVe Cream. Apply all over twice daily until rash improved. Avoid face, groin, underarms.  Return in about 1 year (around 09/03/2021) for TBSE.  IJamesetta Orleans, CMA, am acting as scribe for Brendolyn Patty, MD . Documentation: I have reviewed the above documentation for accuracy and completeness, and I agree with the above.  Brendolyn Patty MD

## 2021-09-30 ENCOUNTER — Encounter: Payer: Federal, State, Local not specified - PPO | Admitting: Dermatology

## 2021-12-09 ENCOUNTER — Encounter: Payer: Self-pay | Admitting: Dermatology

## 2021-12-09 ENCOUNTER — Ambulatory Visit (INDEPENDENT_AMBULATORY_CARE_PROVIDER_SITE_OTHER): Payer: Medicare Other | Admitting: Dermatology

## 2021-12-09 DIAGNOSIS — D225 Melanocytic nevi of trunk: Secondary | ICD-10-CM

## 2021-12-09 DIAGNOSIS — Z1283 Encounter for screening for malignant neoplasm of skin: Secondary | ICD-10-CM

## 2021-12-09 DIAGNOSIS — L570 Actinic keratosis: Secondary | ICD-10-CM

## 2021-12-09 DIAGNOSIS — L578 Other skin changes due to chronic exposure to nonionizing radiation: Secondary | ICD-10-CM

## 2021-12-09 DIAGNOSIS — L820 Inflamed seborrheic keratosis: Secondary | ICD-10-CM | POA: Diagnosis not present

## 2021-12-09 DIAGNOSIS — Z85828 Personal history of other malignant neoplasm of skin: Secondary | ICD-10-CM

## 2021-12-09 DIAGNOSIS — Z86018 Personal history of other benign neoplasm: Secondary | ICD-10-CM

## 2021-12-09 DIAGNOSIS — D229 Melanocytic nevi, unspecified: Secondary | ICD-10-CM

## 2021-12-09 DIAGNOSIS — L821 Other seborrheic keratosis: Secondary | ICD-10-CM

## 2021-12-09 DIAGNOSIS — D1801 Hemangioma of skin and subcutaneous tissue: Secondary | ICD-10-CM

## 2021-12-09 DIAGNOSIS — L814 Other melanin hyperpigmentation: Secondary | ICD-10-CM

## 2021-12-09 NOTE — Progress Notes (Signed)
Follow-Up Visit   Subjective  Lee Young is a 76 y.o. male who presents for the following: Annual Exam.  The patient presents for Total-Body Skin Exam (TBSE) for skin cancer screening and mole check.  The patient has spots, moles and lesions to be evaluated, some may be new or changing. He has a spot to check on his right temple and itchy spot on the left forearm.  He has a history of BCC of the right ear and mid occipital scalp. He has a history of severe dysplastic nevus of the right upper forehead.    The following portions of the chart were reviewed this encounter and updated as appropriate:       Review of Systems:  No other skin or systemic complaints except as noted in HPI or Assessment and Plan.  Objective  Well appearing patient in no apparent distress; mood and affect are within normal limits.  A focused examination was performed including all skin waist up, lower legs. Relevant physical exam findings are noted in the Assessment and Plan.  L forearm x 1, R temple x 1 (2) Erythematous stuck-on, waxy papule  Scalp x 9, L medial cheek x 1 (10) Pink scaly macules  Left Upper Back Left Posterior Shoulder 5.28m brown macule adjacent to waxy white patch   Right Tip of Nose 5 mm flesh papule, tan center   Left Dorsum of Foot 3.0 mm brown macule    Assessment & Plan  Skin cancer screening performed today.  Actinic Damage - Severe, confluent actinic changes with pre-cancerous actinic keratoses  - Severe, chronic, not at goal, secondary to cumulative UV radiation exposure over time - diffuse scaly erythematous macules and papules with underlying dyspigmentation - Discussed Prescription "Field Treatment" for Severe, Chronic Confluent Actinic Changes with Pre-Cancerous Actinic Keratoses Field treatment involves treatment of an entire area of skin that has confluent Actinic Changes (Sun/ Ultraviolet light damage) and PreCancerous Actinic Keratoses by method of  PhotoDynamic Therapy (PDT) and/or prescription Topical Chemotherapy agents such as 5-fluorouracil, 5-fluorouracil/calcipotriene, and/or imiquimod.  The purpose is to decrease the number of clinically evident and subclinical PreCancerous lesions to prevent progression to development of skin cancer by chemically destroying early precancer changes that may or may not be visible.  It has been shown to reduce the risk of developing skin cancer in the treated area. As a result of treatment, redness, scaling, crusting, and open sores may occur during treatment course. One or more than one of these methods may be used and may have to be used several times to control, suppress and eliminate the PreCancerous changes. Discussed treatment course, expected reaction, and possible side effects. - Recommend daily broad spectrum sunscreen SPF 30+ to sun-exposed areas, reapply every 2 hours as needed.  - Staying in the shade or wearing long sleeves, sun glasses (UVA+UVB protection) and wide brim hats (4-inch brim around the entire circumference of the hat) are also recommended. - Call for new or changing lesions. - Start 5-fluorouracil/calcipotriene cream twice a day for 7 days to affected areas including scalp. Prescription sent to [facility]. Patient advised they will receive an email to purchase the medication online and have it sent to their home. Patient provided with handout reviewing treatment course and side effects and advised to call or message [email or web address] with any concerns.   Lentigines - Scattered tan macules - Due to sun exposure - Benign-appearing, observe - Recommend daily broad spectrum sunscreen SPF 30+ to sun-exposed areas, reapply  every 2 hours as needed. - Call for any changes  Seborrheic Keratoses - Stuck-on, waxy, tan-brown papules and/or plaques  - Benign-appearing - Discussed benign etiology and prognosis. - Observe - Call for any changes  Hemangiomas - Red  papules - Discussed benign nature - Observe - Call for any changes  History of Basal Cell Carcinoma of the Skin - No evidence of recurrence today of the right ear, upper occipital scalp - Recommend regular full body skin exams - Recommend daily broad spectrum sunscreen SPF 30+ to sun-exposed areas, reapply every 2 hours as needed.  - Call if any new or changing lesions are noted between office visits  History of Dysplastic Nevus - No evidence of recurrence today upper forehead - Recommend regular full body skin exams - Recommend daily broad spectrum sunscreen SPF 30+ to sun-exposed areas, reapply every 2 hours as needed.  - Call if any new or changing lesions are noted between office visits  Inflamed seborrheic keratosis (2) L forearm x 1, R temple x 1  Symptomatic, irritating, patient would like treated.  Destruction of lesion - L forearm x 1, R temple x 1  Destruction method: cryotherapy   Informed consent: discussed and consent obtained   Lesion destroyed using liquid nitrogen: Yes   Region frozen until ice ball extended beyond lesion: Yes   Outcome: patient tolerated procedure well with no complications   Post-procedure details: wound care instructions given   Additional details:  Prior to procedure, discussed risks of blister formation, small wound, skin dyspigmentation, or rare scar following cryotherapy. Recommend Vaseline ointment to treated areas while healing.   AK (actinic keratosis) (10) Scalp x 9, L medial cheek x 1  Actinic keratoses are precancerous spots that appear secondary to cumulative UV radiation exposure/sun exposure over time. They are chronic with expected duration over 1 year. A portion of actinic keratoses will progress to squamous cell carcinoma of the skin. It is not possible to reliably predict which spots will progress to skin cancer and so treatment is recommended to prevent development of skin cancer.  Recommend daily broad spectrum sunscreen SPF  30+ to sun-exposed areas, reapply every 2 hours as needed.  Recommend staying in the shade or wearing long sleeves, sun glasses (UVA+UVB protection) and wide brim hats (4-inch brim around the entire circumference of the hat). Call for new or changing lesions.  Destruction of lesion - Scalp x 9, L medial cheek x 1  Destruction method: cryotherapy   Informed consent: discussed and consent obtained   Lesion destroyed using liquid nitrogen: Yes   Region frozen until ice ball extended beyond lesion: Yes   Outcome: patient tolerated procedure well with no complications   Post-procedure details: wound care instructions given   Additional details:  Prior to procedure, discussed risks of blister formation, small wound, skin dyspigmentation, or rare scar following cryotherapy. Recommend Vaseline ointment to treated areas while healing.   Nevus Left Upper Back  Vs Lentigo - L post shoulder  Benign-appearing, stable.  Observation.  Call clinic for new or changing moles.  Recommend daily use of broad spectrum spf 30+ sunscreen to sun-exposed areas.    Return in about 1 year (around 12/10/2022) for UBSE.  IJamesetta Orleans, CMA, am acting as scribe for Brendolyn Patty, MD .  Documentation: I have reviewed the above documentation for accuracy and completeness, and I agree with the above.  Brendolyn Patty MD

## 2021-12-09 NOTE — Patient Instructions (Addendum)
Cryotherapy Aftercare  Wash gently with soap and water everyday.   Apply Vaseline and Band-Aid daily until healed.   - Start 5-fluorouracil/calcipotriene cream twice a day for 7-10 days to affected areas including scalp. Prescription sent to [facility]. Patient advised they will receive an email to purchase the medication online and have it sent to their home. Patient provided with handout reviewing treatment course and side effects and advised to call or message [place] on MyChart with any concerns.  Instructions for [facility] Medications  One or more of your medications was sent to the [facility] order [facility]. You will receive an email from them and can purchase the medicine through that link. It will then be mailed to your home at the address you confirmed. If for any reason you do not receive an email from them, please check your spam folder. If you still do not find the email, please let us know. [facility] phone number is [phone].   5-Fluorouracil/Calcipotriene Patient Education   Actinic keratoses are the dry, red scaly spots on the skin caused by sun damage. A portion of these spots can turn into skin cancer with time, and treating them can help prevent development of skin cancer.   Treatment of these spots requires removal of the defective skin cells. There are various ways to remove actinic keratoses, including freezing with liquid nitrogen, treatment with creams, or treatment with a blue light procedure in the office.   5-fluorouracil cream is a topical cream used to treat actinic keratoses. It works by interfering with the growth of abnormal fast-growing skin cells, such as actinic keratoses. These cells peel off and are replaced by healthy ones.   5-fluorouracil/calcipotriene is a combination of the 5-fluorouracil cream with a vitamin D analog cream called calcipotriene. The calcipotriene alone does not treat actinic  keratoses. However, when it is combined with 5-fluorouracil, it helps the 5-fluorouracil treat the actinic keratoses much faster so that the same results can be achieved with a much shorter treatment time.  INSTRUCTIONS FOR 5-FLUOROURACIL/CALCIPOTRIENE CREAM:   5-fluorouracil/calcipotriene cream typically only needs to be used for 7-10 days. A thin layer should be applied twice a day to the treatment areas recommended by your physician.   If your physician prescribed you separate tubes of 5-fluourouracil and calcipotriene, apply a thin layer of 5-fluorouracil followed by a thin layer of calcipotriene.   Avoid contact with your eyes, nostrils, and mouth. Do not use 5-fluorouracil/calcipotriene cream on infected or open wounds.   You will develop redness, irritation and some crusting at areas where you have pre-cancer damage/actinic keratoses. IF YOU DEVELOP PAIN, BLEEDING, OR SIGNIFICANT CRUSTING, STOP THE TREATMENT EARLY - you have already gotten a good response and the actinic keratoses should clear up well.  Wash your hands after applying 5-fluorouracil 5% cream on your skin.   A moisturizer or sunscreen with a minimum SPF 30 should be applied each morning.   Once you have finished the treatment, you can apply a thin layer of Vaseline twice a day to irritated areas to soothe and calm the areas more quickly. If you experience significant discomfort, contact your physician.  For some patients it is necessary to repeat the treatment for best results.  SIDE EFFECTS: When using 5-fluorouracil/calcipotriene cream, you may have mild irritation, such as redness, dryness, swelling, or a mild burning sensation. This usually resolves within 2 weeks. The more actinic keratoses you have, the more redness and inflammation you can expect during  treatment. Eye irritation has been reported rarely. If this occurs, please let us know.  If you have any trouble using this cream, please call the office. If you have  any other questions about this information, please do not hesitate to ask me before you leave the office.   Due to recent changes in healthcare laws, you may see results of your pathology and/or laboratory studies on MyChart before the doctors have had a chance to review them. We understand that in some cases there may be results that are confusing or concerning to you. Please understand that not all results are received at the same time and often the doctors may need to interpret multiple results in order to provide you with the best plan of care or course of treatment. Therefore, we ask that you please give [place] 2 business days to thoroughly review all your results before contacting the office for clarification. Should we see a critical lab result, you will be contacted sooner.   If You Need Anything After Your Visit  If you have any questions or concerns for your doctor, please call our main line at [phone] and press option 4 to reach your doctor's medical assistant. If no one answers, please leave a voicemail as directed and we will return your call as soon as possible. Messages left after 4 pm will be answered the following business day.   You may also send [place] a message via [place]. We typically respond to MyChart messages within 1-2 business days.  For prescription refills, please ask your pharmacy to contact our office. Our fax number is [phone].  If you have an urgent issue when the clinic is closed that cannot wait until the next business day, you can page your doctor at the number below.    Please note that while we do our best to be available for urgent issues outside of office hours, we are not available 24/7.   If you have an urgent issue and are unable to reach [place], you may choose to seek medical care at your doctor's office, retail clinic, urgent care center, or emergency room.  If you have a medical emergency, please immediately call 911 or go to the emergency  department.  Pager Numbers  - Dr. Nehemiah Massed: [phone]  - Dr. Laurence Ferrari: [phone]  - Dr. Nicole Kindred: [phone]  In the event of inclement weather, please call our main line at [phone] for an update on the status of any delays or closures.  Dermatology Medication Tips: Please keep the boxes that topical medications come in in order to help keep track of the instructions about where and how to use these. Pharmacies typically print the medication instructions only on the boxes and not directly on the medication tubes.   If your medication is too expensive, please contact our office at [phone] option 4 or send [place] a message through [place].   We are unable to tell what your co-pay for medications will be in advance as this is different depending on your insurance coverage. However, we may be able to find a substitute medication at lower cost or fill out paperwork to get insurance to cover a needed medication.   If a prior authorization is required to get your medication covered by your insurance company, please allow [place] 1-2 business days to complete this process.  Drug prices often vary depending on where the prescription is filled and some pharmacies may offer cheaper prices.  The website www.goodrx.com contains coupons for medications through different  pharmacies. The prices here do not account for what the cost may be with help from insurance (it may be cheaper with your insurance), but the website can give you the price if you did not use any insurance.  - You can print the associated coupon and take it with your prescription to the pharmacy.  - You may also stop by our office during regular business hours and pick up a GoodRx coupon card.  - If you need your prescription sent electronically to a different pharmacy, notify our office through [facility] or by phone at [phone] option 4.     Si Usted Necesita Algo Despus de Su Visita  Tambin puede enviarnos un  mensaje a travs de Pharmacist, community. Por lo general respondemos a los mensajes de MyChart en el transcurso de 1 a 2 das hbiles.  Para renovar recetas, por favor pida a su farmacia que se ponga en contacto con nuestra oficina. Harland Dingwall de fax es [place] [phone].  Si tiene un asunto urgente cuando la clnica est cerrada y que no puede esperar hasta el siguiente da hbil, puede llamar/localizar a su doctor(a) al nmero que aparece a continuacin.   Por favor, tenga en cuenta que aunque hacemos todo lo posible para estar disponibles para asuntos urgentes fuera del horario de [place], no estamos disponibles las 24 horas del da, los 7 das de la [place].   Si tiene un problema urgente y no puede comunicarse con nosotros, puede optar por buscar atencin mdica  en el consultorio de su doctor(a), en una clnica privada, en un centro de atencin urgente o en una sala de emergencias.  Si tiene Engineering geologist, por favor llame inmediatamente al 911 o vaya a la sala de emergencias.  Nmeros de bper  - Dr. Nehemiah Massed: [phone]  - Dra. Moye: [phone]  - Dra. Nicole Kindred: [phone]  En caso de inclemencias del [place], por favor llame a Johnsie Kindred principal al [phone] para una actualizacin sobre el [place] de cualquier retraso o cierre.  Consejos para la medicacin en dermatologa: Por favor, guarde las cajas en las que vienen los medicamentos de uso tpico para ayudarle a seguir las instrucciones sobre dnde y cmo usarlos. Las farmacias generalmente imprimen las instrucciones del medicamento slo en las cajas y no directamente en los tubos del [place].   Si su medicamento es muy caro, por favor, pngase en contacto con Zigmund Daniel llamando al [phone] y presione la opcin 4 o envenos un mensaje a travs de Pharmacist, community.   No podemos decirle cul ser su copago por los medicamentos por adelantado ya que esto es diferente dependiendo de la cobertura de su seguro. Sin embargo,  es posible que podamos encontrar un medicamento sustituto a Electrical engineer un formulario para que el seguro cubra el medicamento que se considera necesario.   Si se requiere una autorizacin previa para que su compaa de seguros [place] su medicamento, por favor permtanos de 1 a 2 das hbiles para completar este proceso.  Los precios de los medicamentos varan con frecuencia dependiendo del Environmental consultant de dnde se surte la receta y alguna farmacias pueden ofrecer precios ms baratos.  El sitio web www.goodrx.com tiene cupones para medicamentos de Airline pilot. Los precios aqu no tienen en cuenta lo que podra costar con la ayuda del seguro (puede ser ms barato con su seguro), pero el sitio web puede darle el precio si no utiliz Research scientist (physical sciences).  - Puede imprimir el cupn correspondiente y llevarlo con su  con su receta a la farmacia.  - Tambin puede pasar por nuestra oficina durante el horario de atencin regular y recoger una tarjeta de cupones de GoodRx.  - Si necesita que su receta se enve electrnicamente a una farmacia diferente, informe a nuestra oficina a travs de MyChart de [place] o por telfono llamando al [phone] y presione la opcin 4.  

## 2022-10-15 ENCOUNTER — Ambulatory Visit: Payer: Medicare Other | Admitting: Dermatology

## 2022-10-15 VITALS — BP 140/64 | HR 85

## 2022-10-15 DIAGNOSIS — B353 Tinea pedis: Secondary | ICD-10-CM

## 2022-10-15 DIAGNOSIS — D229 Melanocytic nevi, unspecified: Secondary | ICD-10-CM

## 2022-10-15 DIAGNOSIS — Z5111 Encounter for antineoplastic chemotherapy: Secondary | ICD-10-CM

## 2022-10-15 DIAGNOSIS — B351 Tinea unguium: Secondary | ICD-10-CM

## 2022-10-15 DIAGNOSIS — L821 Other seborrheic keratosis: Secondary | ICD-10-CM

## 2022-10-15 DIAGNOSIS — L570 Actinic keratosis: Secondary | ICD-10-CM

## 2022-10-15 DIAGNOSIS — Z86018 Personal history of other benign neoplasm: Secondary | ICD-10-CM

## 2022-10-15 DIAGNOSIS — W908XXA Exposure to other nonionizing radiation, initial encounter: Secondary | ICD-10-CM

## 2022-10-15 DIAGNOSIS — L578 Other skin changes due to chronic exposure to nonionizing radiation: Secondary | ICD-10-CM

## 2022-10-15 DIAGNOSIS — D2239 Melanocytic nevi of other parts of face: Secondary | ICD-10-CM

## 2022-10-15 DIAGNOSIS — L300 Nummular dermatitis: Secondary | ICD-10-CM

## 2022-10-15 DIAGNOSIS — L820 Inflamed seborrheic keratosis: Secondary | ICD-10-CM | POA: Diagnosis not present

## 2022-10-15 DIAGNOSIS — Z1283 Encounter for screening for malignant neoplasm of skin: Secondary | ICD-10-CM | POA: Diagnosis not present

## 2022-10-15 DIAGNOSIS — L814 Other melanin hyperpigmentation: Secondary | ICD-10-CM

## 2022-10-15 DIAGNOSIS — L918 Other hypertrophic disorders of the skin: Secondary | ICD-10-CM | POA: Diagnosis not present

## 2022-10-15 DIAGNOSIS — Z7189 Other specified counseling: Secondary | ICD-10-CM

## 2022-10-15 DIAGNOSIS — D2272 Melanocytic nevi of left lower limb, including hip: Secondary | ICD-10-CM

## 2022-10-15 DIAGNOSIS — S0081XA Abrasion of other part of head, initial encounter: Secondary | ICD-10-CM

## 2022-10-15 DIAGNOSIS — Z85828 Personal history of other malignant neoplasm of skin: Secondary | ICD-10-CM

## 2022-10-15 DIAGNOSIS — Z872 Personal history of diseases of the skin and subcutaneous tissue: Secondary | ICD-10-CM

## 2022-10-15 MED ORDER — TERBINAFINE HCL 250 MG PO TABS: 250.0000 mg | ORAL_TABLET | Freq: Every day | ORAL | 2 refills | Status: AC

## 2022-10-15 MED ORDER — TAVABOROLE 5 % EX SOLN: CUTANEOUS | 11 refills | Status: AC

## 2022-10-15 NOTE — Progress Notes (Signed)
Follow-Up Visit   Subjective  Lee Young is a 77 y.o. male who presents for the following: Skin Cancer Screening and Full Body Skin Exam hx of bcc, hx of dysplastic, hx of aks, hx of isks,   Reports scaly spot a right temple, frozen in past, has recurred, Also has thickened discolored toenail he would like treated.  No h/o liver problems.  wife is with patient and contributes to history.   The patient presents for Total-Body Skin Exam (TBSE) for skin cancer screening and mole check. The patient has spots, moles and lesions to be evaluated, some may be new or changing and the patient may have concern these could be cancer.    The following portions of the chart were reviewed this encounter and updated as appropriate: medications, allergies, medical history  Review of Systems:  No other skin or systemic complaints except as noted in HPI or Assessment and Plan.  Objective  Well appearing patient in no apparent distress; mood and affect are within normal limits.  A full examination was performed including scalp, head, eyes, ears, nose, lips, neck, chest, axillae, abdomen, back, buttocks, bilateral upper extremities, bilateral lower extremities, hands, feet, fingers, toes, fingernails, and toenails. All findings within normal limits unless otherwise noted below.   Relevant physical exam findings are noted in the Assessment and Plan.         right temple x 1 Erythematous stuck-on, waxy papule     Assessment & Plan   SKIN CANCER SCREENING PERFORMED TODAY.  Acrochordons (Skin Tags) At BL antecubita  - Fleshy, skin-colored pedunculated papules - Benign appearing.  - Observe. - If desired, they can be removed with an in office procedure that is not covered by insurance. - Please call the clinic if you notice any new or changing lesions.   ACTINIC DAMAGE WITH PRECANCEROUS ACTINIC KERATOSES Counseling for Topical Chemotherapy Management: Patient exhibits: - Severe,  confluent actinic changes with pre-cancerous actinic keratoses that is secondary to cumulative UV radiation exposure over time - Condition that is severe; chronic, not at goal. - diffuse scaly erythematous macules and papules with underlying dyspigmentation - Discussed Prescription "Field Treatment" topical Chemotherapy for Severe, Chronic Confluent Actinic Changes with Pre-Cancerous Actinic Keratoses Field treatment involves treatment of an entire area of skin that has confluent Actinic Changes (Sun/ Ultraviolet light damage) and PreCancerous Actinic Keratoses by method of PhotoDynamic Therapy (PDT) and/or prescription Topical Chemotherapy agents such as 5-fluorouracil, 5-fluorouracil/calcipotriene, and/or imiquimod.  The purpose is to decrease the number of clinically evident and subclinical PreCancerous lesions to prevent progression to development of skin cancer by chemically destroying early precancer changes that may or may not be visible.  It has been shown to reduce the risk of developing skin cancer in the treated area. As a result of treatment, redness, scaling, crusting, and open sores may occur during treatment course. One or more than one of these methods may be used and may have to be used several times to control, suppress and eliminate the PreCancerous changes. Discussed treatment course, expected reaction, and possible side effects. - Recommend daily broad spectrum sunscreen SPF 30+ to sun-exposed areas, reapply every 2 hours as needed.  - Staying in the shade or wearing long sleeves, sun glasses (UVA+UVB protection) and wide brim hats (4-inch brim around the entire circumference of the hat) are also recommended. - Call for new or changing lesions.  - Pt has had good results to scalp with 5FU/VitD cream.  Recommend patient do another course in the  fall twice a day for 7 days to scalp, pt has  Reviewed course of treatment and expected reaction.  Patient advised to expect inflammation and  crusting and advised that erosions are possible.  Patient advised to be diligent with sun protection during and after treatment. Handout with details of how to apply medication and what to expect provided. Counseled to keep medication out of reach of children and pets.      EXCORIATION Exam: Excoriation at left temple   Treatment Plan: Benign appearing. Recommend vaseline every day until healed. Call if not resolving.  LENTIGINES, SEBORRHEIC KERATOSES, HEMANGIOMAS - Benign normal skin lesions - Benign-appearing - Call for any changes - Lentigo  Left Posterior Shoulder  5.74mm brown macule adjacent to waxy white patch - Benign-appearing.  Observation.  Call clinic for new or changing lesions.  Recommend daily use of broad spectrum spf 30+ sunscreen to sun-exposed areas.    MELANOCYTIC NEVI - Tan-brown and/or pink-flesh-colored symmetric macules and papules - Right Tip of Nose 5 mm flesh papule, tan center  - Left Dorsum of Foot 3.0 mm brown macule - Benign appearing on exam today - Observation - Call clinic for new or changing moles - Recommend daily use of broad spectrum spf 30+ sunscreen to sun-exposed areas.    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 Currently well-controlled.  Treatment Plan: Continue clobetasol solution mixed in CeraVe Cream Patient to mix together and apply all over twice daily prn flares dsp 50mL 1Rf. Avoid face, groin, axilla. Up to four weeks.   Continue mild soap and moisturizing cream 1-2 times daily to help prevent flares. 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 Condition is bothersome/symptomatic for patient. Currently flared.   Treatment  Plan: Reviewed baseline Lft's from 10/15/22 wnl  Start Kerydin solution - apply topically to aa toenail nightly, if too expensive, use GoodRx Start terbinafine 250 mg tab take 1 po qd with food 2 rfs. Pt may call for additional month refill if needed.  Discussed Terbinafine Counseling  Terbinafine is an anti-fungal medicine that can be applied to the skin (over the counter) or taken by mouth (prescription) to treat fungal infections. The pill version is often used to treat fungal infections of the nails or scalp. While most people do not have any side effects from taking terbinafine pills, some possible side effects of the medicine can include taste changes, headache, loss of smell, vision changes, nausea, vomiting, or diarrhea.   Rare side effects can include irritation of the liver, allergic reaction, or decrease in blood counts (which may show up as not feeling well or developing an infection). If you are concerned about any of these side effects, please stop the medicine and call your doctor, or in the case of an emergency such as feeling very unwell, seek immediate medical care.     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  Call if any new or changing lesions are noted between office visits   HISTORY OF PRECANCEROUS ACTINIC KERATOSIS Scalp  - site(s) of PreCancerous Actinic Keratosis clear today. - these may recur and new lesions may form requiring treatment  to prevent transformation into skin cancer - observe for new or changing spots and contact [facility] for appointment if occur - photoprotection with sun protective clothing; sunglasses and broad spectrum sunscreen with SPF of at least 30 + and frequent self skin exams recommended - yearly exams by a dermatologist recommended for persons with history of PreCancerous Actinic Keratoses  Tinea pedis of right foot  Related Medications Tavaborole 5 % SOLN Apply to affected toenail nightly  terbinafine (LAMISIL) 250 MG tablet Take 1 tablet (250 mg total) by mouth daily. Take with food for tinea pedis  Inflamed seborrheic keratosis right temple x 1  Symptomatic, irritating, patient would like treated.  Destruction of lesion - right temple x 1  Destruction method: cryotherapy   Informed consent: discussed and consent obtained   Lesion destroyed using liquid nitrogen: Yes   Region frozen until ice ball extended beyond lesion: Yes   Outcome: patient tolerated procedure well with no complications   Post-procedure details: wound care instructions given   Additional details:  Prior to procedure, discussed risks of blister formation, small wound, skin dyspigmentation, or rare scar following cryotherapy. Recommend Vaseline ointment to treated areas while healing.   Tinea pedis of left foot   Return in about 1 year (around 10/15/2023) for TBSE.  I, Asher Muir, CMA, am acting as scribe for Willeen Niece, MD.   Documentation: I have reviewed the above documentation for accuracy and completeness, and I agree with the above.  Willeen Niece, MD

## 2022-10-15 NOTE — Patient Instructions (Addendum)
For toenail fungus  Start terbinafine 250 mg tab take 1 by mouth daily with food  Start kerydin solution apply to affected right toenail nightly   Terbinafine Counseling  Terbinafine is an anti-fungal medicine that can be applied to the skin (over the counter) or taken by mouth (prescription) to treat fungal infections. The pill version is often used to treat fungal infections of the nails or scalp. While most people do not have any side effects from taking terbinafine pills, some possible side effects of the medicine can include taste changes, headache, loss of smell, vision changes, nausea, vomiting, or diarrhea.   Rare side effects can include irritation of the liver, allergic reaction, or decrease in blood counts (which may show up as not feeling well or developing an infection). If you are concerned about any of these side effects, please stop the medicine and call your doctor, or in the case of an emergency such as feeling very unwell, seek immediate medical care.       Can restart  in fall if patient would like    5-fluorouracil/calcipotriene cream twice a day for 7 days to affected areas including scalp.   Reviewed course of treatment and expected reaction.  Patient advised to expect inflammation and crusting and advised that erosions are possible.  Patient advised to be diligent with sun protection during and after treatment. Counseled to keep medication out of reach of children and pets.   Seborrheic Keratosis  What causes seborrheic keratoses? Seborrheic keratoses are harmless, common skin growths that first appear during adult life.  As time goes by, more growths appear.  Some people may develop a large number of them.  Seborrheic keratoses appear on both covered and uncovered body parts.  They are not caused by sunlight.  The tendency to develop seborrheic keratoses can be inherited.  They vary in color from skin-colored to gray, brown, or even black.  They can be either smooth  or have a rough, warty surface.   Seborrheic keratoses are superficial and look as if they were stuck on the skin.  Under the microscope this type of keratosis looks like layers upon layers of skin.  That is why at times the top layer may seem to fall off, but the rest of the growth remains and re-grows.    Treatment Seborrheic keratoses do not need to be treated, but can easily be removed in the office.  Seborrheic keratoses often cause symptoms when they rub on clothing or jewelry.  Lesions can be in the way of shaving.  If they become inflamed, they can cause itching, soreness, or burning.  Removal of a seborrheic keratosis can be accomplished by freezing, burning, or surgery. If any spot bleeds, scabs, or grows rapidly, please return to have it checked, as these can be an indication of a skin cancer.  Cryotherapy Aftercare  Wash gently with soap and water everyday.   Apply Vaseline and Band-Aid daily until healed.      Melanoma ABCDEs  Melanoma is the most dangerous type of skin cancer, and is the leading cause of death from skin disease.  You are more likely to develop melanoma if you: Have light-colored skin, light-colored eyes, or red or blond hair Spend a lot of time in the sun Tan regularly, either outdoors or in a tanning bed Have had blistering sunburns, especially during childhood Have a close family member who has had a melanoma Have atypical moles or large birthmarks  Early detection of melanoma is key  since treatment is typically straightforward and cure rates are extremely high if we catch it early.   The first sign of melanoma is often a change in a mole or a new dark spot.  The ABCDE system is a way of remembering the signs of melanoma.  A for asymmetry:  The two halves do not match. B for border:  The edges of the growth are irregular. C for color:  A mixture of colors are present instead of an even brown color. D for diameter:  Melanomas are usually (but not  always) greater than 6mm - the size of a pencil eraser. E for evolution:  The spot keeps changing in size, shape, and color.  Please check your skin once per month between visits. You can use a small mirror in front and a large mirror behind you to keep an eye on the back side or your body.   If you see any new or changing lesions before your next follow-up, please call to schedule a visit.  Please continue daily skin protection including broad spectrum sunscreen SPF 30+ to sun-exposed areas, reapplying every 2 hours as needed when you're outdoors.   Staying in the shade or wearing long sleeves, sun glasses (UVA+UVB protection) and wide brim hats (4-inch brim around the entire circumference of the hat) are also recommended for sun protection.     Due to recent changes in healthcare laws, you may see results of your pathology and/or laboratory studies on MyChart before the doctors have had a chance to review them. We understand that in some cases there may be results that are confusing or concerning to you. Please understand that not all results are received at the same time and often the doctors may need to interpret multiple results in order to provide you with the best plan of care or course of treatment. Therefore, we ask that you please give [place] 2 business days to thoroughly review all your results before contacting the office for clarification. Should we see a critical lab result, you will be contacted sooner.   If You Need Anything After Your Visit  If you have any questions or concerns for your doctor, please call our main line at [phone] and press option 4 to reach your doctor's medical assistant. If no one answers, please leave a voicemail as directed and we will return your call as soon as possible. Messages left after 4 pm will be answered the following business day.   You may also send [place] a message via MyChart. We typically respond to MyChart messages within 1-2 business  days.  For prescription refills, please ask your pharmacy to contact our office. Our fax number is [phone].  If you have an urgent issue when the clinic is closed that cannot wait until the next business day, you can page your doctor at the number below.    Please note that while we do our best to be available for urgent issues outside of office hours, we are not available 24/7.   If you have an urgent issue and are unable to reach [place], you may choose to seek medical care at your doctor's office, retail clinic, urgent care center, or emergency room.  If you have a medical emergency, please immediately call 911 or go to the emergency department.  Pager Numbers  - Dr. Gwen Pounds: [phone]  - Dr. Roseanne Reno: [phone]  In the event of inclement weather, please call our main line at [phone] for an update on the status  of any delays or closures.  Dermatology Medication Tips: Please keep the boxes that topical medications come in in order to help keep track of the instructions about where and how to use these. Pharmacies typically print the medication instructions only on the boxes and not directly on the medication tubes.   If your medication is too expensive, please contact our office at [phone] option 4 or send [place] a message through MyChart.   We are unable to tell what your co-pay for medications will be in advance as this is different depending on your insurance coverage. However, we may be able to find a substitute medication at lower cost or fill out paperwork to get insurance to cover a needed medication.   If a prior authorization is required to get your medication covered by your insurance company, please allow [place] 1-2 business days to complete this process.  Drug prices often vary depending on where the prescription is filled and some pharmacies may offer cheaper prices.  The website www.goodrx.com contains coupons for medications through different pharmacies. The  prices here do not account for what the cost may be with help from insurance (it may be cheaper with your insurance), but the website can give you the price if you did not use any insurance.  - You can print the associated coupon and take it with your prescription to the pharmacy.  - You may also stop by our office during regular business hours and pick up a GoodRx coupon card.  - If you need your prescription sent electronically to a different pharmacy, notify our office through [facility] or by phone at [phone] option 4.     Si Usted Necesita Algo Despus de Su Visita  Tambin puede enviarnos un mensaje a travs de Clinical cytogeneticist. Por lo general respondemos a los mensajes de MyChart en el transcurso de 1 a 2 das hbiles.  Para renovar recetas, por favor pida a su farmacia que se ponga en contacto con nuestra oficina. Annie Sable de fax es [place] [phone].  Si tiene un asunto urgente cuando la clnica est cerrada y que no puede esperar hasta el siguiente da hbil, puede llamar/localizar a su doctor(a) al nmero que aparece a continuacin.   Por favor, tenga en cuenta que aunque hacemos todo lo posible para estar disponibles para asuntos urgentes fuera del horario de [place], no estamos disponibles las 24 horas del da, los 7 [address]  [address] de la [place].   Si tiene un problema urgente y no puede comunicarse con nosotros, puede optar por buscar atencin mdica  en el consultorio de su doctor(a), en una clnica privada, en un centro de atencin urgente o en una sala de emergencias.  Si tiene Engineer, drilling, por favor llame inmediatamente al 911 o vaya a la sala de emergencias.  Nmeros de bper  - Dr. Gwen Pounds: [phone]  - Dra. Roseanne Reno: [phone]  En caso de inclemencias del [place], por favor llame a Lacy Duverney principal al [phone] para una actualizacin sobre el [place] de cualquier retraso o cierre.  Consejos para la medicacin en dermatologa: Por favor, guarde  las cajas en las que vienen los medicamentos de uso tpico para ayudarle a seguir las instrucciones sobre dnde y cmo usarlos. Las farmacias generalmente imprimen las instrucciones del medicamento slo en las cajas y no directamente en los tubos del [place].   Si su medicamento es muy caro, por favor, pngase en contacto con Rolm Gala llamando al [phone] y presione la opcin 4 o envenos  un mensaje a travs de MyChart.   No podemos decirle cul ser su copago por los medicamentos por adelantado ya que esto es diferente dependiendo de la cobertura de su seguro. Sin embargo, es posible que podamos encontrar un medicamento sustituto a Audiological scientist un formulario para que el seguro cubra el medicamento que se considera necesario.   Si se requiere una autorizacin previa para que su compaa de seguros [place] su medicamento, por favor permtanos de 1 a 2 das hbiles para completar [address]  Los precios de los medicamentos varan con frecuencia dependiendo del Environmental consultant de dnde se surte la receta y alguna farmacias pueden ofrecer precios ms baratos.  El sitio web www.goodrx.com tiene cupones para medicamentos de Health and safety inspector. Los precios aqu no tienen en cuenta lo que podra costar con la ayuda del seguro (puede ser ms barato con su seguro), pero el sitio web puede darle el precio si no utiliz Tourist information centre manager.  - Puede imprimir el cupn correspondiente y llevarlo con su receta a la farmacia.  - Tambin puede pasar por nuestra oficina durante el horario de atencin regular y Education officer, museum una tarjeta de cupones de GoodRx.  - Si necesita que su receta se enve electrnicamente a una farmacia diferente, informe a nuestra oficina a travs de MyChart de [place] o por telfono llamando al [phone] y presione la opcin 4.

## 2022-12-28 ENCOUNTER — Encounter: Payer: Federal, State, Local not specified - PPO | Admitting: Dermatology

## 2023-02-11 ENCOUNTER — Other Ambulatory Visit: Payer: Self-pay | Admitting: Medical Genetics

## 2023-02-16 ENCOUNTER — Other Ambulatory Visit
Admission: RE | Admit: 2023-02-16 | Discharge: 2023-02-16 | Disposition: A | Discharge status: Home / Self Care | Payer: Self-pay | Source: Ambulatory Visit | Attending: Medical Genetics | Admitting: Medical Genetics

## 2023-02-17 ENCOUNTER — Other Ambulatory Visit: Payer: Self-pay | Admitting: Medical Genetics

## 2023-03-02 LAB — GENECONNECT MOLECULAR SCREEN: Genetic Analysis Overall Interpretation: NEGATIVE

## 2023-11-02 ENCOUNTER — Ambulatory Visit: Payer: Federal, State, Local not specified - PPO | Admitting: Dermatology

## 2023-11-02 DIAGNOSIS — W908XXA Exposure to other nonionizing radiation, initial encounter: Secondary | ICD-10-CM

## 2023-11-02 DIAGNOSIS — D239 Other benign neoplasm of skin, unspecified: Secondary | ICD-10-CM

## 2023-11-02 DIAGNOSIS — L578 Other skin changes due to chronic exposure to nonionizing radiation: Secondary | ICD-10-CM | POA: Diagnosis not present

## 2023-11-02 DIAGNOSIS — Z1283 Encounter for screening for malignant neoplasm of skin: Secondary | ICD-10-CM | POA: Diagnosis not present

## 2023-11-02 DIAGNOSIS — D229 Melanocytic nevi, unspecified: Secondary | ICD-10-CM

## 2023-11-02 DIAGNOSIS — L821 Other seborrheic keratosis: Secondary | ICD-10-CM

## 2023-11-02 DIAGNOSIS — B351 Tinea unguium: Secondary | ICD-10-CM

## 2023-11-02 DIAGNOSIS — Z86018 Personal history of other benign neoplasm: Secondary | ICD-10-CM

## 2023-11-02 DIAGNOSIS — D1801 Hemangioma of skin and subcutaneous tissue: Secondary | ICD-10-CM

## 2023-11-02 DIAGNOSIS — L570 Actinic keratosis: Secondary | ICD-10-CM

## 2023-11-02 DIAGNOSIS — L814 Other melanin hyperpigmentation: Secondary | ICD-10-CM

## 2023-11-02 DIAGNOSIS — L300 Nummular dermatitis: Secondary | ICD-10-CM | POA: Diagnosis not present

## 2023-11-02 DIAGNOSIS — Z79899 Other long term (current) drug therapy: Secondary | ICD-10-CM

## 2023-11-02 DIAGNOSIS — Z85828 Personal history of other malignant neoplasm of skin: Secondary | ICD-10-CM

## 2023-11-02 DIAGNOSIS — D2272 Melanocytic nevi of left lower limb, including hip: Secondary | ICD-10-CM

## 2023-11-02 MED ORDER — CLOBETASOL PROPIONATE 0.05 % EX SOLN: CUTANEOUS | 2 refills | Status: AC

## 2023-11-02 NOTE — Patient Instructions (Addendum)
 Eczema Skin Care  Buy TWO 16oz jars of CeraVe moisturizing cream  [facility], [facility], [facility] (no prescription needed)  Costs about $15 per jar   Jar #1: Use as a moisturizer as needed. Can be applied to any area of the body. Use twice daily to unaffected areas.  Jar #2: Pour one 50ml bottle of clobetasol  0.05% solution into jar, mix well. Label this jar to indicate the medication has been added. Use twice daily to affected areas. Do not apply to face, groin or underarms.  Moisturizer may burn or sting initially. Try for at least 4 weeks.    Cryotherapy Aftercare  Wash gently with soap and water everyday.   Apply Vaseline and Band-Aid daily until healed.   Due to recent changes in healthcare laws, you may see results of your pathology and/or laboratory studies on MyChart before the doctors have had a chance to review them. We understand that in some cases there may be results that are confusing or concerning to you. Please understand that not all results are received at the same time and often the doctors may need to interpret multiple results in order to provide you with the best plan of care or course of treatment. Therefore, we ask that you please give us  2 business days to thoroughly review all your results before contacting the office for clarification. Should we see a critical lab result, you will be contacted sooner.   If You Need Anything After Your Visit  If you have any questions or concerns for your doctor, please call our main line at [phone] and press option 4 to reach your doctor's medical assistant. If no one answers, please leave a voicemail as directed and we will return your call as soon as possible. Messages left after 4 pm will be answered the following business day.   You may also send us  a message via MyChart. We typically respond to MyChart messages within 1-2 business days.  For prescription refills, please ask your pharmacy to contact our office. Our fax number is  [phone].  If you have an urgent issue when the clinic is closed that cannot wait until the next business day, you can page your doctor at the number below.    Please note that while we do our best to be available for urgent issues outside of office hours, we are not available 24/7.   If you have an urgent issue and are unable to reach us , you may choose to seek medical care at your doctor's office, retail clinic, urgent care center, or emergency room.  If you have a medical emergency, please immediately call 911 or go to the emergency department.  Pager Numbers  - Dr. Hester: [phone]  - Dr. Jackquline: [phone]  - Dr. Claudene: [phone]   - Dr. Raymund: [phone]  In the event of inclement weather, please call our main line at [phone] for an update on the status of any delays or closures.  Dermatology Medication Tips: Please keep the boxes that topical medications come in in order to help keep track of the instructions about where and how to use these. Pharmacies typically print the medication instructions only on the boxes and not directly on the medication tubes.   If your medication is too expensive, please contact our office at [phone] option 4 or send us  a message through MyChart.   We are unable to tell what your co-pay for medications will be in advance as this is different depending on your insurance coverage.  However, we may be able to find a substitute medication at lower cost or fill out paperwork to get insurance to cover a needed medication.   If a prior authorization is required to get your medication covered by your insurance company, please allow us  1-2 business days to complete this process.  Drug prices often vary depending on where the prescription is filled and some pharmacies may offer cheaper prices.  The website www.goodrx.com contains coupons for medications through different pharmacies. The prices here do not account for what the cost  may be with help from insurance (it may be cheaper with your insurance), but the website can give you the price if you did not use any insurance.  - You can print the associated coupon and take it with your prescription to the pharmacy.  - You may also stop by our office during regular business hours and pick up a GoodRx coupon card.  - If you need your prescription sent electronically to a different pharmacy, notify our office through [facility] or by phone at [phone] option 4.     Si Usted Necesita Algo Despus de Su Visita  Tambin puede enviarnos un mensaje a travs de Clinical cytogeneticist. Por lo general respondemos a los mensajes de MyChart en el transcurso de 1 a 2 das hbiles.  Para renovar recetas, por favor pida a su farmacia que se ponga en contacto con nuestra oficina. Randi lakes de fax es [place] [phone].  Si tiene un asunto urgente cuando la clnica est cerrada y que no puede esperar hasta el siguiente da hbil, puede llamar/localizar a su doctor(a) al nmero que aparece a continuacin.   Por favor, tenga en cuenta que aunque hacemos todo lo posible para estar disponibles para asuntos urgentes fuera del horario de [place], no estamos disponibles las 24 horas del da, los 7 [address]  [address] de la [place].   Si tiene un problema urgente y no puede comunicarse con nosotros, puede optar por buscar atencin mdica  en el consultorio de su doctor(a), en una clnica privada, en un centro de atencin urgente o en una sala de emergencias.  Si tiene Engineer, drilling, por favor llame inmediatamente al 911 o vaya a la sala de emergencias.  Nmeros de bper  - Dr. Hester: [phone]  - Dra. Jackquline: [phone]  - Dr. Claudene: [phone]  - Dra. Kitts: [phone]  En caso de inclemencias del [place], por favor llame a nuestra lnea principal al [phone] para una actualizacin sobre el [place] de cualquier retraso o cierre.  Consejos para la medicacin en dermatologa: Por  favor, guarde las cajas en las que vienen los medicamentos de uso tpico para ayudarle a seguir las instrucciones sobre dnde y cmo usarlos. Las farmacias generalmente imprimen las instrucciones del medicamento slo en las cajas y no directamente en los tubos del IXL.   Si su medicamento es muy caro, por favor, pngase en contacto con landry rieger llamando al [phone] y presione la opcin 4 o envenos un mensaje a travs de Clinical cytogeneticist.   No podemos decirle cul ser su copago por los medicamentos por adelantado ya que esto es diferente dependiendo de la cobertura de su seguro. Sin embargo, es posible que podamos encontrar un medicamento sustituto a Audiological scientist un formulario para que el seguro cubra el medicamento que se considera necesario.   Si se requiere una autorizacin previa para que su compaa de seguros [place] su medicamento, por favor permtanos de 1 a 2 das hbiles para [employer]  proceso.  Los precios de los medicamentos varan con frecuencia dependiendo del Environmental consultant de dnde se surte la receta y alguna farmacias pueden ofrecer precios ms baratos.  El sitio web www.goodrx.com tiene cupones para medicamentos de Health and safety inspector. Los precios aqu no tienen en cuenta lo que podra costar con la ayuda del seguro (puede ser ms barato con su seguro), pero el sitio web puede darle el precio si no utiliz Tourist information centre manager.  - Puede imprimir el cupn correspondiente y llevarlo con su receta a la farmacia.  - Tambin puede pasar por nuestra oficina durante el horario de atencin regular y Education officer, museum una tarjeta de cupones de GoodRx.  - Si necesita que su receta se enve electrnicamente a una farmacia diferente, informe a nuestra oficina a travs de MyChart de [place] o por telfono llamando al [phone] y presione la opcin 4.

## 2023-11-02 NOTE — Progress Notes (Signed)
 Follow-Up Visit   Subjective  Lee Young is a 78 y.o. male who presents for the following: Skin Cancer Screening and Full Body Skin Exam, hx of bcc, hx of dysplastic, hx of aks, hx of isks. He has a few crusted spots on back of his scalp to check, picks at and bleeds. He treated frontal scalp this past fall with 5FU/Calcipotriene cream with a good reaction.   The patient presents for Total-Body Skin Exam (TBSE) for skin cancer screening and mole check. The patient has spots, moles and lesions to be evaluated, some may be new or changing.    The following portions of the chart were reviewed this encounter and updated as appropriate: medications, allergies, medical history  Review of Systems:  No other skin or systemic complaints except as noted in HPI or Assessment and Plan.  Objective  Well appearing patient in no apparent distress; mood and affect are within normal limits.  A full examination was performed including scalp, head, eyes, ears, nose, lips, neck, chest, axillae, abdomen, back, buttocks, bilateral upper extremities, bilateral lower extremities, hands, feet, fingers, toes, fingernails, and toenails. All findings within normal limits unless otherwise noted below.   Relevant physical exam findings are noted in the Assessment and Plan.  L occipital scalp x 4, L  zygoma x 1, R temple x 1 (6) Pink scaly papules at scalp.  Pink scaly macules at face  Assessment & Plan   SKIN CANCER SCREENING PERFORMED TODAY.  ACTINIC DAMAGE WITH PRECANCEROUS ACTINIC KERATOSES Counseling for Topical Chemotherapy Management: Patient exhibits: - Severe, confluent actinic changes with pre-cancerous actinic keratoses that is secondary to cumulative UV radiation exposure over time - Condition that is severe; chronic, not at goal. - diffuse scaly erythematous macules and papules with underlying dyspigmentation - Discussed Prescription Field Treatment topical Chemotherapy for Severe, Chronic  Confluent Actinic Changes with Pre-Cancerous Actinic Keratoses Field treatment involves treatment of an entire area of skin that has confluent Actinic Changes (Sun/ Ultraviolet light damage) and PreCancerous Actinic Keratoses by method of PhotoDynamic Therapy (PDT) and/or prescription Topical Chemotherapy agents such as 5-fluorouracil, 5-fluorouracil/calcipotriene, and/or imiquimod.  The purpose is to decrease the number of clinically evident and subclinical PreCancerous lesions to prevent progression to development of skin cancer by chemically destroying early precancer changes that may or may not be visible.  It has been shown to reduce the risk of developing skin cancer in the treated area. As a result of treatment, redness, scaling, crusting, and open sores may occur during treatment course. One or more than one of these methods may be used and may have to be used several times to control, suppress and eliminate the PreCancerous changes. Discussed treatment course, expected reaction, and possible side effects. - Recommend daily broad spectrum sunscreen SPF 30+ to sun-exposed areas, reapply every 2 hours as needed.  - Staying in the shade or wearing long sleeves, sun glasses (UVA+UVB protection) and wide brim hats (4-inch brim around the entire circumference of the hat) are also recommended. - Call for new or changing lesions. - Pt has had good results to frontal scalp with 5FU/VitD cream in the past. Recommend patient do another course in the fall twice a day for 7-10 days to posterior scalp.  LENTIGINES, SEBORRHEIC KERATOSES, HEMANGIOMAS - Benign normal skin lesions - Benign-appearing - Call for any changes  MELANOCYTIC NEVI - Tan-brown and/or pink-flesh-colored symmetric macules and papules - Right Tip of Nose 5 mm flesh papule, tan center - Left Dorsum of Foot 3.0 mm  brown macule - Benign appearing on exam today - Observation - Call clinic for new or changing moles - Recommend daily use of  broad spectrum spf 30+ sunscreen to sun-exposed areas.   LENTIGO vs SK Exam: 9 mm tan macule, slightly waxy at right anterior neck  Benign-appearing.  Observation.  Call clinic for new or changing lesions.  Recommend daily use of broad spectrum spf 30+ sunscreen to sun-exposed areas.  SABRA    LENTIGO  Exam: Left Posterior Shoulder  4.6mm brown macule adjacent to waxy white patch  Benign-appearing.  Observation.  Call clinic for new or changing lesions.  Recommend daily use of broad spectrum spf 30+ sunscreen to sun-exposed areas.   ONYCHOMYCOSIS Exam: Clear gt toenail when compared to baseline photo, after terbinafine  250 mg x 3 months and Kerydin  solution.  Clear. Observe for recurrence.       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 Currently well-controlled.  Nummular dermatitis (eczema) is a chronic, relapsing, itchy rash that can significantly affect quality of life. It is often associated with dry skin and flares in the wintertime, and may require treatment with prescription topical anti-inflammatory medications, in addition to gentle skin care.  If there is associated atopic dermatitis and topicals are not working, then biologic injections may be necessary to clear rash and control symptoms.  Treatment Plan: Continue clobetasol  solution mixed in CeraVe Cream Patient to mix together and apply all over twice daily prn flares dsp 50mL 1Rf. Avoid face, groin, axilla. Up to four weeks.    Continue mild soap and moisturizing cream 1-2 times daily to help prevent flares. Dry skin care handout given   Topical steroids (such as triamcinolone, fluocinolone, fluocinonide, mometasone, clobetasol , halobetasol, betamethasone, hydrocortisone) can cause thinning and lightening of the skin if they are used for too long in the same area. Your physician has selected the right strength medicine for your  problem and area affected on the body. Please use your medication only as directed by your physician to prevent side effects.   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  Call if any new or changing lesions are noted between office visits AK (ACTINIC KERATOSIS) (6) L occipital scalp x 4, L  zygoma x 1, R temple x 1 (6) Vs ISKs (scalp)  Actinic keratoses are precancerous spots that appear secondary to cumulative UV radiation exposure/sun exposure over time. They are chronic with expected duration over 1 year. A portion of actinic keratoses will progress to squamous cell carcinoma of the skin. It is not possible to reliably predict which spots will progress to skin cancer and so treatment is recommended to prevent development of skin cancer.  Recommend daily broad spectrum sunscreen SPF 30+ to sun-exposed areas, reapply every 2 hours as needed.  Recommend staying in the shade or wearing long sleeves, sun glasses (UVA+UVB protection) and wide brim hats (4-inch brim around  the entire circumference of the hat). Call for new or changing lesions. Destruction of lesion - L occipital scalp x 4, L  zygoma x 1, R temple x 1 (6)  Destruction method: cryotherapy   Informed consent: discussed and consent obtained   Lesion destroyed using liquid nitrogen: Yes   Region frozen until ice ball extended beyond lesion: Yes   Outcome: patient tolerated procedure well with no  complications   Post-procedure details: wound care instructions given   Additional details:  Prior to procedure, discussed risks of blister formation, small wound, skin dyspigmentation, or rare scar following cryotherapy. Recommend Vaseline ointment to treated areas while healing.   NUMMULAR DERMATITIS   Related Medications clobetasol  (TEMOVATE ) 0.05 % external solution Patient to mix solution in jar of CeraVe Cream. Apply all over twice daily until rash improved. Avoid face, groin, underarms. Return in about 1 year (around 11/01/2024) for TBSE.  IAndrea Kerns, CMA, am acting as scribe for Rexene Rattler, MD .   Documentation: I have reviewed the above documentation for accuracy and completeness, and I agree with the above.  Rexene Rattler, MD

## 2024-11-07 ENCOUNTER — Encounter: Admitting: Dermatology
